# Patient Record
Sex: Male | Born: 1970
Health system: Southern US, Community
[De-identification: ages and names within clinical notes are randomized; demographics above are authoritative.]

## PROBLEM LIST (undated history)

## (undated) DIAGNOSIS — T7840XA Allergy, unspecified, initial encounter: Secondary | ICD-10-CM

## (undated) DIAGNOSIS — E785 Hyperlipidemia, unspecified: Secondary | ICD-10-CM

## (undated) DIAGNOSIS — I10 Essential (primary) hypertension: Secondary | ICD-10-CM

## (undated) DIAGNOSIS — J849 Interstitial pulmonary disease, unspecified: Secondary | ICD-10-CM

## (undated) HISTORY — PX: TOTAL KNEE ARTHROPLASTY: SHX125

## (undated) HISTORY — PX: JOINT REPLACEMENT: SHX530

## (undated) HISTORY — DX: Hyperlipidemia, unspecified: E78.5

## (undated) HISTORY — PX: HAND DEBRIDEMENT: SHX974

## (undated) HISTORY — DX: Interstitial pulmonary disease, unspecified: J84.9

## (undated) HISTORY — DX: Allergy, unspecified, initial encounter: T78.40XA

## (undated) HISTORY — PX: KNEE ARTHROSCOPY W/ ACL RECONSTRUCTION: SHX1858

## (undated) HISTORY — PX: KNEE SURGERY: SHX244

## (undated) HISTORY — DX: Essential (primary) hypertension: I10

---

## 1994-01-05 HISTORY — PX: COLONOSCOPY: SHX174

## 1999-02-15 ENCOUNTER — Emergency Department (HOSPITAL_COMMUNITY): Admission: EM | Admit: 1999-02-15 | Discharge: 1999-02-15 | Payer: Self-pay | Admitting: Emergency Medicine

## 2003-11-27 ENCOUNTER — Ambulatory Visit: Payer: Self-pay | Admitting: Internal Medicine

## 2003-12-07 ENCOUNTER — Ambulatory Visit: Payer: Self-pay | Admitting: Internal Medicine

## 2004-01-17 ENCOUNTER — Ambulatory Visit: Payer: Self-pay | Admitting: Internal Medicine

## 2004-01-24 ENCOUNTER — Ambulatory Visit: Payer: Self-pay | Admitting: Internal Medicine

## 2005-10-14 ENCOUNTER — Ambulatory Visit (HOSPITAL_BASED_OUTPATIENT_CLINIC_OR_DEPARTMENT_OTHER): Admission: RE | Admit: 2005-10-14 | Discharge: 2005-10-14 | Payer: Self-pay | Admitting: Orthopedic Surgery

## 2005-10-28 ENCOUNTER — Ambulatory Visit: Payer: Self-pay | Admitting: Internal Medicine

## 2005-10-28 LAB — CONVERTED CEMR LAB
ALT: 63 units/L — ABNORMAL HIGH (ref 0–40)
AST: 33 units/L (ref 0–37)
Albumin: 4.7 g/dL (ref 3.5–5.2)
Alkaline Phosphatase: 43 units/L (ref 39–117)
BUN: 14 mg/dL (ref 6–23)
Basophils Absolute: 0.1 10*3/uL (ref 0.0–0.1)
Basophils Relative: 1.1 % — ABNORMAL HIGH (ref 0.0–1.0)
CO2: 29 meq/L (ref 19–32)
Calcium: 10.2 mg/dL (ref 8.4–10.5)
Chloride: 101 meq/L (ref 96–112)
Chol/HDL Ratio, serum: 11
Cholesterol: 367 mg/dL (ref 0–200)
Creatinine, Ser: 1 mg/dL (ref 0.4–1.5)
Eosinophil percent: 1.8 % (ref 0.0–5.0)
GFR calc non Af Amer: 90 mL/min
Glomerular Filtration Rate, Af Am: 109 mL/min/{1.73_m2}
Glucose, Bld: 95 mg/dL (ref 70–99)
HCT: 44.3 % (ref 39.0–52.0)
HDL: 33.3 mg/dL — ABNORMAL LOW (ref >39.0)
Hemoglobin: 15.1 g/dL (ref 13.0–17.0)
LDL DIRECT: 226.7 mg/dL
Lymphocytes Relative: 24 % (ref 12.0–46.0)
MCHC: 34 g/dL (ref 30.0–36.0)
MCV: 88.9 fL (ref 78.0–100.0)
Monocytes Absolute: 0.5 10*3/uL (ref 0.2–0.7)
Monocytes Relative: 6.1 % (ref 3.0–11.0)
Neutro Abs: 5.5 10*3/uL (ref 1.4–7.7)
Neutrophils Relative %: 67 % (ref 43.0–77.0)
Platelets: 422 10*3/uL — ABNORMAL HIGH (ref 150–400)
Potassium: 4.3 meq/L (ref 3.5–5.1)
RBC: 4.98 M/uL (ref 4.22–5.81)
RDW: 13 % (ref 11.5–14.6)
Sodium: 138 meq/L (ref 135–145)
TSH: 1.07 microintl units/mL (ref 0.35–5.50)
Total Bilirubin: 1 mg/dL (ref 0.3–1.2)
Total Protein: 8.1 g/dL (ref 6.0–8.3)
Triglyceride fasting, serum: 494 mg/dL (ref 0–149)
VLDL: 99 mg/dL — ABNORMAL HIGH (ref 0–40)
WBC: 8.2 10*3/uL (ref 4.5–10.5)

## 2005-11-04 ENCOUNTER — Ambulatory Visit: Payer: Self-pay | Admitting: Internal Medicine

## 2005-11-04 DIAGNOSIS — Z91199 Patient's noncompliance with other medical treatment and regimen due to unspecified reason: Secondary | ICD-10-CM | POA: Insufficient documentation

## 2005-11-04 DIAGNOSIS — F1010 Alcohol abuse, uncomplicated: Secondary | ICD-10-CM | POA: Insufficient documentation

## 2005-11-04 DIAGNOSIS — I10 Essential (primary) hypertension: Secondary | ICD-10-CM | POA: Insufficient documentation

## 2005-11-04 DIAGNOSIS — E785 Hyperlipidemia, unspecified: Secondary | ICD-10-CM | POA: Insufficient documentation

## 2005-11-04 DIAGNOSIS — Z9119 Patient's noncompliance with other medical treatment and regimen: Secondary | ICD-10-CM | POA: Insufficient documentation

## 2005-11-04 LAB — CONVERTED CEMR LAB
Cholesterol, target level: 200 mg/dL
HDL goal, serum: 40 mg/dL
LDL Goal: 160 mg/dL

## 2005-12-22 ENCOUNTER — Ambulatory Visit: Payer: Self-pay | Admitting: Family Medicine

## 2010-03-28 ENCOUNTER — Encounter (HOSPITAL_COMMUNITY)
Admission: RE | Admit: 2010-03-28 | Discharge: 2010-03-28 | Disposition: A | Discharge status: Home / Self Care | Payer: 59 | Source: Ambulatory Visit | Attending: Orthopedic Surgery | Admitting: Orthopedic Surgery

## 2010-03-28 ENCOUNTER — Other Ambulatory Visit (HOSPITAL_COMMUNITY): Payer: Self-pay | Admitting: Orthopedic Surgery

## 2010-03-28 DIAGNOSIS — Z01818 Encounter for other preprocedural examination: Secondary | ICD-10-CM

## 2010-03-28 LAB — COMPREHENSIVE METABOLIC PANEL
AST: 34 U/L (ref 0–37)
Alkaline Phosphatase: 44 U/L (ref 39–117)
CO2: 26 mEq/L (ref 19–32)
Chloride: 100 mEq/L (ref 96–112)
Creatinine, Ser: 1.01 mg/dL (ref 0.4–1.5)
GFR calc Af Amer: >60 mL/min (ref >60)
GFR calc non Af Amer: >60 mL/min (ref >60)
Potassium: 4 mEq/L (ref 3.5–5.1)
Total Bilirubin: 0.5 mg/dL (ref 0.3–1.2)

## 2010-03-28 LAB — URINALYSIS, ROUTINE W REFLEX MICROSCOPIC
Glucose, UA: NEGATIVE mg/dL
Ketones, ur: NEGATIVE mg/dL
Nitrite: NEGATIVE
Protein, ur: NEGATIVE mg/dL

## 2010-03-28 LAB — CBC
HCT: 41.8 % (ref 39.0–52.0)
Hemoglobin: 14.7 g/dL (ref 13.0–17.0)
MCH: 30.4 pg (ref 26.0–34.0)
MCHC: 35.2 g/dL (ref 30.0–36.0)
MCV: 86.5 fL (ref 78.0–100.0)
Platelets: 307 10*3/uL (ref 150–400)
RBC: 4.83 MIL/uL (ref 4.22–5.81)
RDW: 12.4 % (ref 11.5–15.5)
WBC: 7.4 10*3/uL (ref 4.0–10.5)

## 2010-03-28 LAB — ABO/RH: ABO/RH(D): A POS

## 2010-03-28 LAB — PROTIME-INR
INR: 0.96 (ref 0.00–1.49)
Prothrombin Time: 13 seconds (ref 11.6–15.2)

## 2010-03-28 LAB — APTT: aPTT: 29 seconds (ref 24–37)

## 2010-04-02 ENCOUNTER — Inpatient Hospital Stay (HOSPITAL_COMMUNITY): Payer: 59

## 2010-04-02 ENCOUNTER — Inpatient Hospital Stay (HOSPITAL_COMMUNITY)
Admission: RE | Admit: 2010-04-02 | Discharge: 2010-04-06 | DRG: 462 | Disposition: A | Discharge status: Home Health | Payer: 59 | Source: Ambulatory Visit | Attending: Orthopedic Surgery | Admitting: Orthopedic Surgery

## 2010-04-02 DIAGNOSIS — E7800 Pure hypercholesterolemia, unspecified: Secondary | ICD-10-CM | POA: Diagnosis present

## 2010-04-02 DIAGNOSIS — I10 Essential (primary) hypertension: Secondary | ICD-10-CM | POA: Diagnosis present

## 2010-04-02 DIAGNOSIS — IMO0002 Reserved for concepts with insufficient information to code with codable children: Principal | ICD-10-CM | POA: Diagnosis present

## 2010-04-02 DIAGNOSIS — M1710 Unilateral primary osteoarthritis, unspecified knee: Principal | ICD-10-CM | POA: Diagnosis present

## 2010-04-02 DIAGNOSIS — L02419 Cutaneous abscess of limb, unspecified: Secondary | ICD-10-CM | POA: Diagnosis not present

## 2010-04-02 DIAGNOSIS — Z01812 Encounter for preprocedural laboratory examination: Secondary | ICD-10-CM

## 2010-04-03 LAB — BASIC METABOLIC PANEL
BUN: 6 mg/dL (ref 6–23)
CO2: 27 mEq/L (ref 19–32)
Chloride: 100 mEq/L (ref 96–112)
Creatinine, Ser: 1.07 mg/dL (ref 0.4–1.5)
Glucose, Bld: 111 mg/dL — ABNORMAL HIGH (ref 70–99)
Potassium: 3.2 mEq/L — ABNORMAL LOW (ref 3.5–5.1)

## 2010-04-03 LAB — CBC
HCT: 34 % — ABNORMAL LOW (ref 39.0–52.0)
MCH: 30.3 pg (ref 26.0–34.0)
MCHC: 34.4 g/dL (ref 30.0–36.0)
MCV: 88.1 fL (ref 78.0–100.0)
RDW: 12.6 % (ref 11.5–15.5)

## 2010-04-03 LAB — PROTIME-INR: INR: 1.17 (ref 0.00–1.49)

## 2010-04-04 LAB — CBC
HCT: 35.2 % — ABNORMAL LOW (ref 39.0–52.0)
MCH: 29.6 pg (ref 26.0–34.0)
MCHC: 34.1 g/dL (ref 30.0–36.0)
MCV: 86.9 fL (ref 78.0–100.0)
Platelets: 204 10*3/uL (ref 150–400)
RDW: 12.6 % (ref 11.5–15.5)

## 2010-04-04 LAB — BASIC METABOLIC PANEL
CO2: 27 mEq/L (ref 19–32)
Calcium: 9.3 mg/dL (ref 8.4–10.5)
Chloride: 97 mEq/L (ref 96–112)
Creatinine, Ser: 0.88 mg/dL (ref 0.4–1.5)
Glucose, Bld: 116 mg/dL — ABNORMAL HIGH (ref 70–99)

## 2010-04-05 LAB — BASIC METABOLIC PANEL
BUN: 9 mg/dL (ref 6–23)
CO2: 27 mEq/L (ref 19–32)
GFR calc non Af Amer: >60 mL/min (ref >60)
Glucose, Bld: 105 mg/dL — ABNORMAL HIGH (ref 70–99)
Potassium: 3.7 mEq/L (ref 3.5–5.1)
Sodium: 133 mEq/L — ABNORMAL LOW (ref 135–145)

## 2010-04-05 LAB — PROTIME-INR: Prothrombin Time: 21.3 seconds — ABNORMAL HIGH (ref 11.6–15.2)

## 2010-04-05 LAB — CBC
MCH: 29.9 pg (ref 26.0–34.0)
MCHC: 34.4 g/dL (ref 30.0–36.0)
MCV: 87.1 fL (ref 78.0–100.0)
Platelets: 224 10*3/uL (ref 150–400)

## 2010-04-06 LAB — BASIC METABOLIC PANEL
BUN: 10 mg/dL (ref 6–23)
CO2: 25 mEq/L (ref 19–32)
Chloride: 94 mEq/L — ABNORMAL LOW (ref 96–112)
Creatinine, Ser: 0.82 mg/dL (ref 0.4–1.5)
Glucose, Bld: 107 mg/dL — ABNORMAL HIGH (ref 70–99)
Potassium: 3.9 mEq/L (ref 3.5–5.1)

## 2010-04-06 LAB — CBC
HCT: 31 % — ABNORMAL LOW (ref 39.0–52.0)
Hemoglobin: 10.6 g/dL — ABNORMAL LOW (ref 13.0–17.0)
MCH: 29.3 pg (ref 26.0–34.0)
MCHC: 34.2 g/dL (ref 30.0–36.0)
RDW: 12.6 % (ref 11.5–15.5)

## 2010-04-06 LAB — PROTIME-INR: INR: 1.83 — ABNORMAL HIGH (ref 0.00–1.49)

## 2010-04-10 NOTE — Op Note (Signed)
NAMEDARRIE, MACMILLAN NO.:  [id number]  MEDICAL RECORD NO.:  [id number]           PATIENT TYPE:  I  LOCATION:  5041                         FACILITY:  [facility]  PHYSICIAN:  Loreta Ave, M.D. DATE OF BIRTH:  March 19, 1970  DATE OF PROCEDURE:  04/03/2010 DATE OF DISCHARGE:                              OPERATIVE REPORT   PREOPERATIVE DIAGNOSES: 1. Right knee end-stage degenerative arthritis.  Previous operative     intervention for anterior cruciate ligament reconstruction with     retained metallic screws, proximal tibia and distal femur.  Varus     alignment. 2. Left knee end-stage degenerative arthritis, varus alignment.     Previous operative intervention with anterior cruciate ligament     reconstruction with retained transverse screw and washer as well as     two staples in the femoral shaft well above the knee joint.  POSTOPERATIVE DIAGNOSIS: 1. Right knee end-stage degenerative arthritis.  Previous operative     intervention for anterior cruciate ligament reconstruction with     retained metallic screws, proximal tibia and distal femur.  Varus     alignment. 2. Left knee end-stage degenerative arthritis, varus alignment.     Previous operative intervention with anterior cruciate ligament     reconstruction with retained transverse screw and washer as well as     two staples in the femoral shaft well above the knee joint.  PROCEDURES: 1. Right knee modified minimally invasive total knee replacement,     Stryker triathlon prosthesis.  Soft tissue balancing.  Cemented     pegged posterior stabilized #6 femoral component.  Cemented #6     tibial component with a 9-mm polyethylene insert.  Cemented pegged     medial offset 38-mm patellar component. 2. Removal of the interference screw, proximal tibia, right. 3. Removal of interference screw, distal femur, right. 4. Left knee modified minimally invasive total knee replacement,     Stryker triathlon  prosthesis.  Soft tissue balancing.  Cemented     pegged posterior stabilized #6 femoral component.  Cemented #6     tibial component with a 9-mm polyethylene insert.  Cemented pegged     medial offset 38-mm patellar component.  SURGEON:  Loreta Ave, MD  ASSISTANT:  Zonia Kief, PA present throughout the entire case and necessary for timely completion of the procedure.  ANESTHESIA:  General.  ESTIMATED BLOOD LOSS:  Minimal.  SPECIMENS:  None.  COUNTS:  None.  COMPLICATIONS:  None.  DRESSING:  Soft compressive with Hemovac x1 in each knee.  Knee immobilizer, each knee.  TOURNIQUET TIME:  1 hour 30 minutes on the left and 1 hour 15 minutes on the right.  PROCEDURE IN DETAIL:  The patient was brought to the operating room and placed on the operating table in supine position.  After adequate anesthesia had been obtained, both knees were examined.  Mild flexion contracture on both sides.  Further flexion to 120.  Varus alignment partially correctable.  Tourniquet applied to both legs.  Both were then prepped and draped in usual sterile fashion.  Attention turned to the left first.  Exsanguinated  with elevation and Esmarch, tourniquet inflated to 350 mmHg.  Straight incision above the patella down to tibial tubercle.  Hemostasis with cautery.  Medial arthrotomy, vastus splitting preserving quad tendon.  Knee exposed.  Intra-articular adhesions and scar removed.  Grade 4 changes throughout.  Loose bodies. Periarticular spurs, remnants of menisci, cruciate ligaments excised. Distal femur exposed.  Intramedullary guide was placed in the distal femur and I was able to get by the staples and screws, so they did not have to be removed.  Distal cut 10 mm in 5 degrees of valgus.  Using epicondylar axis, sized, cut, and fitted for posterior stabilized, pegged #6 component.  Proximal tibial resection, extramedullary guide, 3- degree posterior slope cut.  Sized to #6 component as  well.  Patella exposed, posterior 10 mm removed.  Sized, drilled, and fitted for a 38- mm component.  All recess examined to be sure all spurs and loose bodies removed including the back cap.  Irrigated.  Trials put in place.  #6 above and below 9-mm insert.  38 mm on the patella.  With this construct, full extension, full flexion and good mechanical axis, good motion and stability.  Good patellofemoral tracking.  Tibia was marked for rotation and reamed.  Trials were removed.  Copious irrigation with a pulse irrigating device.  Cement prepared and placed on all components, firmly seated.  Polyethylene attached to tibia and knee reduced.  Patella held with a clamp.  Once the cement hardened, the knee was reexamined, again pleased with alignment, stability, and motion. Hemovac was placed through a separate stab wound.  Arthrotomy closed with #1 Vicryl.  Skin and subcutaneous tissue with Vicryl and staples. Sterile compressive temporary dressing applied.  The tourniquet was left up until we were well into the completion of the right knee to avoid bleeding intraoperatively.  It was deflated at 1 hour and 30 minutes from start on the left.  Attention was turned to the right. Exsanguinated with elevation and Esmarch, tourniquet inflated to 350 mmHg.  Straight incision above the patella down to tibial tubercle. Hemostasis with cautery.  Medial arthrotomy.  Medial capsule release. The proximal tibia exposed.  There was bony overgrowth but I was able to find the screw, placed from outside-in.  This was exposed and then able to be removed with a standard large AO screwdriver.  Knee exposed. Marked overgrowth of the notch, so I could not immediately retrieve the femoral screw.  Remnants of menisci, cruciate ligaments, periarticular spurs, and loose bodies removed.  Intramedullary guide on the femur.  10- mm resection set at 5 degrees of valgus.  Using epicondylar axis, the femur was then sized,  cut, and fitted for posterior stabilized #6 component.  Before doing the box cut, I was able to get enough exposure to find the femoral screw core bone around it and then removed that completely.  I then completed the preparation of femur.  Good sizing and fitting with a #6 component.  Extramedullary guide on the tibia.  3- degree posterior slope cut below the defect medially.  Sized to #6 component.  Debris cleared throughout the knee.  Patella exposed, posterior 10 mm removed.  Drilled, sized, and fitted for a 38-mm component.  Trials put in place.  #6 above and below and a 38 on the patella.  9-mm insert.  This gave me excellent mechanical axis, good alignment, good stability, good motion, and good tracking.  Tibia was marked for rotation and reamed.  All trials were removed.  Copious irrigation with pulse irrigating device.  Cement prepared and placed on all components, firmly seated.  Polyethylene attached to tibia and the knee reduced in full extension.  Patella held with a clamp.  Once the cement hardened, the knee was reexamined.  Again, very pleased with alignment, stability, and motion.  Wound was thoroughly irrigated. Hemovac was placed and brought through a separate stab wound. Arthrotomy closed with #1 Vicryl.  Skin and subcutaneous tissue with Vicryl and staples.  Permanent dressing was applied to both sides.  Both Hemovacs were attached to a Autovac device.  Tourniquet was then deflated and removed.  Knee immobilizer was placed on both sides. Anesthesia reversed.  Brought to recovery room.  Tolerated the surgery well.  No complications.     Loreta Ave, M.D.     DFM/MEDQ  D:  04/03/2010  T:  04/04/2010  Job:  [id number]  Electronically Signed by Mckinley Jewel M.D. on 04/10/2010 02:09:42 PM

## 2010-05-13 NOTE — Discharge Summary (Signed)
[name], GETER NO.:  [id number]  MEDICAL RECORD NO.:  [id number]           PATIENT TYPE:  I  LOCATION:  5041                         FACILITY:  [facility]  PHYSICIAN:  Loreta Ave, M.D. DATE OF BIRTH:  1970/06/22  DATE OF ADMISSION:  04/02/2010 DATE OF DISCHARGE:  04/06/2010                              DISCHARGE SUMMARY   FINAL DIAGNOSIS:  Status post bilateral total knee replacements for end- stage degenerative joint disease.  HISTORY OF PRESENT ILLNESS:  A 40 year old white male with history of end-stage DJD bilateral knees and chronic pain presented to our office for preop evaluation for bilateral total knee replacements.  He had progressive worsening pain with failed response with conservative treatment.  Significant decrease in daily activities due to the ongoing complaint.  HOSPITAL COURSE:  On April 02, 2010, the patient was taken to the [facility] OR and bilateral total knee replacements performed along with the screw removal x2, right knee.  SURGEON:  Loreta Ave, MD  ASSISTANT:  Genene Churn. Barry Dienes, [place]  ANESTHESIA:  General.  No specimens.  Minimal blood loss.  TOURNIQUET TIME:  Left knee 117 minutes and right knee 90 minutes.  Autovac drainage used for both knees.  There were no surgical or anesthesia complications and patient was transferred to recovery room in stable condition.  When patient was moved to the orthopedic unit, pharmacy protocol Coumadin and Lovenox started for DVT prophylaxis.  On April 03, 2010, the patient doing well.  Temp 99.8, pulse 97, respirations 20, blood pressure 139/66.  Hemoglobin 11.7, hematocrit 34.0, INR 1.17.  Dressing was clean, dry, and intact.  Bilateral calves nontender and neurovascularly intact.  The skin was warm and dry.  PT/OT consults.  On April 04, 2010, the patient doing well with better pain control. Hemoglobin 12.0, hematocrit 35.2, sodium 134, potassium 3.7, chloride 97, CO2 of  27, BUN 4, creatinine 0.88, glucose 116, INR 1.56.  Bilateral knee wounds looked good and staples intact.  No drainage or signs of infection.  Bilateral knee drains removed.  Calves nontender and neurovascularly intact.  The skin was warm and dry.  Discontinued PCA and saline locked IV.  Progressing with rehab.  On April 05, 2010, the patient doing well.  Temp 98.4, pulse 108, respirations 18, blood pressure 123/87.  Hemoglobin 11.1, INR 1.83.  Right knee wound looks good.  Left knee, he had a slight erythema medial knee and thigh.  He was started on Keflex for possible cellulitis.  On April 06, 2010, the patient doing well, and has made excellent progress with therapy.  He is ready to discharge home.  MEDICATIONS: 1. Dilaudid 2 mg 1-2 tabs p.o. q.4-6 hours p.r.n. for pain. 2. Robaxin 500 mg 1 tab p.o. q.6 hours p.r.n. for spasms. 3. Lovenox one subcu injection q.12 hours and stop when Coumadin is     therapeutic. 4. Coumadin pharmacy protocol. 5. Keflex 500 mg 1 tablet p.o. q.6 hours x10 days.  CONDITION:  Good and stable.  DISPOSITION:  Discharged home.  INSTRUCTIONS:  The patient will work with home health, PT, and OT to improve ambulation and  knee range of motion and strengthening. Weightbear as tolerated.  He can shower, but no tub soaking.  Coumadin x4 weeks postop DVT prophylaxis.  He will follow up when he is 2 weeks postop for recheck and bilateral knee staple removal.  Return sooner if needed.     Genene Churn. Denton Meek.   ______________________________ Loreta Ave, M.D.    JMO/MEDQ  D:  04/23/2010  T:  04/24/2010  Job:  [id number]  Electronically Signed by Zonia Kief P.A. on 05/05/2010 02:05:03 PM Electronically Signed by Mckinley Jewel M.D. on 05/13/2010 01:27:37 PM

## 2012-11-08 ENCOUNTER — Ambulatory Visit (INDEPENDENT_AMBULATORY_CARE_PROVIDER_SITE_OTHER): Payer: 59 | Admitting: Family Medicine

## 2012-11-08 ENCOUNTER — Ambulatory Visit: Payer: 59

## 2012-11-08 VITALS — BP 142/82 | HR 77 | Temp 98.5°F | Resp 18 | Ht 72.0 in | Wt 233.0 lb

## 2012-11-08 DIAGNOSIS — IMO0002 Reserved for concepts with insufficient information to code with codable children: Secondary | ICD-10-CM

## 2012-11-08 DIAGNOSIS — T1490XA Injury, unspecified, initial encounter: Secondary | ICD-10-CM

## 2012-11-08 DIAGNOSIS — L03019 Cellulitis of unspecified finger: Secondary | ICD-10-CM

## 2012-11-08 DIAGNOSIS — Z23 Encounter for immunization: Secondary | ICD-10-CM

## 2012-11-08 DIAGNOSIS — L02519 Cutaneous abscess of unspecified hand: Secondary | ICD-10-CM

## 2012-11-08 DIAGNOSIS — L02511 Cutaneous abscess of right hand: Secondary | ICD-10-CM

## 2012-11-08 MED ORDER — AMOXICILLIN-POT CLAVULANATE 875-125 MG PO TABS: 1.0000 | ORAL_TABLET | Freq: Two times a day (BID) | ORAL | Status: DC

## 2012-11-08 NOTE — Progress Notes (Signed)
Subjective: 42 year old man who was building a deck and got a splinter in his right hand at the base of the right thumb approximately 2 months ago. He thought he got most of it out. Subsequent to that it is formed a hard swollen not at the base of the thumb on the palmar surface. It is been hard. He's not been able to get anything out of it. His wife wanted him to get it checked.  Objective: Approximately a 2 x 3 cm area of slight induration. It does not real acutely inflamed though the surface is a little red. The patient is nondiabetic.  Assessment: Cystic lesion status post foreign body right thumb  Plan: Patient is agreeable to going ahead and having it lanced and see what is in there.  [facility] reading (PRIMARY) by  Dr. Alwyn Ren No foreign body in soft tissues.  Small opacification of metacarpal ?   I&D was done after local anesthetic with 1% lidocaine. A small amount of watery pus was obtained. It appeared to be in 2 pockets. Could not express much more. Culture was taken.  Treat with antibiotic  Return if worse.

## 2012-11-08 NOTE — Patient Instructions (Signed)
Take Augmentin one twice daily  If the area is not gradually going down over the next couple of weeks we should recheck it. Return sooner if worse at any time.

## 2012-11-10 LAB — WOUND CULTURE
Gram Stain: NONE SEEN
Organism ID, Bacteria: NO GROWTH

## 2012-11-29 ENCOUNTER — Ambulatory Visit (INDEPENDENT_AMBULATORY_CARE_PROVIDER_SITE_OTHER): Payer: 59 | Admitting: Physician Assistant

## 2012-11-29 VITALS — BP 136/88 | HR 75 | Temp 98.2°F | Resp 16 | Ht 73.5 in | Wt 233.4 lb

## 2012-11-29 DIAGNOSIS — L03119 Cellulitis of unspecified part of limb: Secondary | ICD-10-CM

## 2012-11-29 DIAGNOSIS — R05 Cough: Secondary | ICD-10-CM

## 2012-11-29 DIAGNOSIS — R059 Cough, unspecified: Secondary | ICD-10-CM

## 2012-11-29 DIAGNOSIS — L02519 Cutaneous abscess of unspecified hand: Secondary | ICD-10-CM

## 2012-11-29 DIAGNOSIS — J40 Bronchitis, not specified as acute or chronic: Secondary | ICD-10-CM

## 2012-11-29 MED ORDER — DOXYCYCLINE HYCLATE 100 MG PO CAPS: 100.0000 mg | ORAL_CAPSULE | Freq: Two times a day (BID) | ORAL | Status: DC

## 2012-11-29 MED ORDER — HYDROCODONE-HOMATROPINE 5-1.5 MG/5ML PO SYRP: ORAL_SOLUTION | ORAL | Status: DC

## 2012-11-29 NOTE — Progress Notes (Signed)
Patient ID: Drew Knight MRN: [id number], DOB: 05-07-1970, 42 y.o. Date of Encounter: 11/29/2012, 9:26 AM  Primary Physician: Gwen Pounds, MD  Chief Complaint: URI and recheck of the right hand  HPI: 42 y.o. male with history below presents with 2 issues.  1) URI: 6 day history of nasal congestion, rhinorrhea, sinus pressure, post nasal drip, and cough. Afebrile. No chills. Cough is productive of green sputum and worse in the morning when he wakes up. No SOB or wheezing. No known sick contacts. No GI complaints.   2) Recheck of the right hand: Patient initially presented to clinic on 11/08/12 after suffering a splinter to the palmer surface of the base of his right thumb 2 months prior. He thought he had removed all of the splinter, but it had become hard and swollen. At that visit the area was x rayed, with no radiopaque foreign body seen and lanced. Upon having the lesion lanced it appeared to be in 2 pockets and a small amount of watery purulence was expressed. A culture was obtained which did not have any growth in 2 days. Provider was unable to express much more purulence and FB was not visualized or found at that OV.   Patient states the following Saturday he was able to express a 2 mm splinter from the opening himself at home. He is still able to express some purulence form the lesion each morning by unroofing the scab. He is tender just proximal and medial to the wound itself. He does state that the wound itself is improved from baseline and even from his visit on 11/08/12. He comes in today requesting further I&D for possible resolution of this.     Past Medical History  Diagnosis Date  . Hyperlipidemia   . Hypertension      Home Meds: Prior to Admission medications   Medication Sig Start Date End Date Taking? Authorizing Provider  lisinopril (PRINIVIL,ZESTRIL) 20 MG tablet Take 20 mg by mouth daily.   Yes Historical Provider, MD  rosuvastatin (CRESTOR) 40 MG tablet Take 40 mg  by mouth daily.   Yes Historical Provider, MD                  Allergies: No Known Allergies  History   Social History  . Marital Status: Married    Spouse Name: N/A    Number of Children: N/A  . Years of Education: N/A   Occupational History  . Not on file.   Social History Main Topics  . Smoking status: Former Games developer  . Smokeless tobacco: Not on file  . Alcohol Use: Yes  . Drug Use: No  . Sexual Activity: Not on file   Other Topics Concern  . Not on file   Social History Narrative  . No narrative on file     Review of Systems: Constitutional: negative for chills, fever, or fatigue  HEENT: see above Cardiovascular: negative for chest pain or palpitations Respiratory: positive for cough. negative for hemoptysis, wheezing, or shortness of breath Abdominal: negative for abdominal pain, nausea, vomiting, or diarrhea Dermatological: see above Neurologic: negative for headache   Physical Exam: Blood pressure 136/88, pulse 75, temperature 98.2 F (36.8 C), temperature source Oral, resp. rate 16, height 6' 1.5" (1.867 m), weight 233 lb 6.4 oz (105.87 kg), SpO2 98.00%., Body mass index is 30.37 kg/(m^2). General: Well developed, well nourished, in no acute distress. Head: Normocephalic, atraumatic, eyes without discharge, sclera non-icteric, nares are congested. Bilateral auditory canals clear, TM's  are without perforation, pearly grey and translucent with reflective cone of light bilaterally. Oral cavity moist, posterior pharynx with post nasal drip. No exudate, erythema, or peritonsillar abscess. Uvula midline. Neck: Supple. No thyromegaly. Full ROM. No lymphadenopathy. Lungs: Coarse breath sounds bilaterally without wheezes, rales, or rhonchi. Breathing is unlabored. Heart: RRR with S1 S2. No murmurs, rubs, or gallops appreciated. Msk:  Strength and tone normal for age. Extremities/Skin: Warm and dry. No clubbing or cyanosis. Right palmer hand with 1 cm scabbed over  vertical wound. Surrounding induration. There is TTP along the proximal/medial aspect of the wound. Able to express a drop of purulence mixed with blood. Culture is taken. No fluctuance. No surrounding erythema. FROM of all digits. Flexion and extension is intact. Normal sensation.   Neuro: Alert and oriented X 3. Moves all extremities spontaneously. Gait is normal. CNII-XII grossly in tact. Psych:  Responds to questions appropriately with a normal affect.     PROCEDURE NOTE: Verbal consent obtained. Risks and benefits of the procedure were explained to the patient. Patient made an informed decision to proceed with the procedure. Betadine prep per usual protocol. Local anesthesia obtained with 1% plain lidocaine 2 cc.  1 cm incision with medial flap made with 15 blade along lesion.  Culture taken prior to incision as lesion was already draining purulence mixed with blood.  Small amount of purulence expressed from proximal aspect. Lesion explored revealing no loculations. No FB visualized or felt with probe. Irrigated with normal saline. Dressed. Wound care instructions including precautions with patient. Patient tolerated the procedure well.     ASSESSMENT AND PLAN:  42 y.o. male with cellulitis of the right hand, bronchitis, and cough  1) Cellulitis of the right hand -I&D per above -Doxycycline 100 mg 1 po bid #20 no RF -Warm compresses -If persists into the next 5-7 days, or worsens at any point plan to refer to hand for further evaluation and treatment -Precautions discussed  2) Bronchitis and cough -Doxycycline per above -Hycodan #4oz 1 tsp po q 4-6 hours prn cough no RF SED -Rest/fluids   Signed, Eula Listen, PA-C [facility] [place], [place] [address] [phone] 11/29/2012 9:26 AM

## 2012-12-01 LAB — WOUND CULTURE
Gram Stain: NONE SEEN
Gram Stain: NONE SEEN

## 2012-12-02 ENCOUNTER — Other Ambulatory Visit: Payer: Self-pay | Admitting: Physician Assistant

## 2012-12-02 DIAGNOSIS — L03119 Cellulitis of unspecified part of limb: Secondary | ICD-10-CM

## 2015-05-21 ENCOUNTER — Other Ambulatory Visit: Payer: Self-pay | Admitting: Internal Medicine

## 2015-05-21 DIAGNOSIS — R7989 Other specified abnormal findings of blood chemistry: Secondary | ICD-10-CM

## 2015-05-21 DIAGNOSIS — R945 Abnormal results of liver function studies: Secondary | ICD-10-CM

## 2015-05-28 ENCOUNTER — Ambulatory Visit
Admission: RE | Admit: 2015-05-28 | Discharge: 2015-05-28 | Disposition: A | Discharge status: Home / Self Care | Payer: 59 | Source: Ambulatory Visit | Attending: Internal Medicine | Admitting: Internal Medicine

## 2015-05-28 DIAGNOSIS — R7989 Other specified abnormal findings of blood chemistry: Secondary | ICD-10-CM

## 2015-05-28 DIAGNOSIS — R945 Abnormal results of liver function studies: Secondary | ICD-10-CM

## 2016-05-20 DIAGNOSIS — I10 Essential (primary) hypertension: Secondary | ICD-10-CM | POA: Diagnosis not present

## 2016-05-20 DIAGNOSIS — E784 Other hyperlipidemia: Secondary | ICD-10-CM | POA: Diagnosis not present

## 2016-05-25 ENCOUNTER — Other Ambulatory Visit: Payer: Self-pay | Admitting: Internal Medicine

## 2016-05-25 DIAGNOSIS — J3089 Other allergic rhinitis: Secondary | ICD-10-CM | POA: Diagnosis not present

## 2016-05-25 DIAGNOSIS — E785 Hyperlipidemia, unspecified: Secondary | ICD-10-CM

## 2016-05-25 DIAGNOSIS — Z1389 Encounter for screening for other disorder: Secondary | ICD-10-CM | POA: Diagnosis not present

## 2016-05-25 DIAGNOSIS — Z0000 Encounter for general adult medical examination without abnormal findings: Secondary | ICD-10-CM | POA: Diagnosis not present

## 2016-05-25 DIAGNOSIS — R7309 Other abnormal glucose: Secondary | ICD-10-CM | POA: Diagnosis not present

## 2016-05-28 ENCOUNTER — Other Ambulatory Visit: Payer: 59

## 2016-07-10 DIAGNOSIS — L500 Allergic urticaria: Secondary | ICD-10-CM | POA: Diagnosis not present

## 2017-06-01 DIAGNOSIS — R82998 Other abnormal findings in urine: Secondary | ICD-10-CM | POA: Diagnosis not present

## 2017-06-01 DIAGNOSIS — I10 Essential (primary) hypertension: Secondary | ICD-10-CM | POA: Diagnosis not present

## 2017-06-01 DIAGNOSIS — Z0000 Encounter for general adult medical examination without abnormal findings: Secondary | ICD-10-CM | POA: Diagnosis not present

## 2017-06-07 DIAGNOSIS — R7309 Other abnormal glucose: Secondary | ICD-10-CM | POA: Diagnosis not present

## 2017-06-07 DIAGNOSIS — Z1389 Encounter for screening for other disorder: Secondary | ICD-10-CM | POA: Diagnosis not present

## 2017-06-07 DIAGNOSIS — J3089 Other allergic rhinitis: Secondary | ICD-10-CM | POA: Diagnosis not present

## 2017-06-07 DIAGNOSIS — Z125 Encounter for screening for malignant neoplasm of prostate: Secondary | ICD-10-CM | POA: Diagnosis not present

## 2017-06-07 DIAGNOSIS — Z0000 Encounter for general adult medical examination without abnormal findings: Secondary | ICD-10-CM | POA: Diagnosis not present

## 2017-06-10 ENCOUNTER — Other Ambulatory Visit: Payer: Self-pay | Admitting: Internal Medicine

## 2017-06-10 DIAGNOSIS — R030 Elevated blood-pressure reading, without diagnosis of hypertension: Secondary | ICD-10-CM | POA: Diagnosis not present

## 2017-06-10 DIAGNOSIS — E785 Hyperlipidemia, unspecified: Secondary | ICD-10-CM

## 2017-06-10 DIAGNOSIS — S339XXA Sprain of unspecified parts of lumbar spine and pelvis, initial encounter: Secondary | ICD-10-CM | POA: Diagnosis not present

## 2017-06-22 ENCOUNTER — Other Ambulatory Visit: Payer: 59

## 2017-06-28 ENCOUNTER — Ambulatory Visit
Admission: RE | Admit: 2017-06-28 | Discharge: 2017-06-28 | Disposition: A | Discharge status: Home / Self Care | Payer: 59 | Source: Ambulatory Visit | Attending: Internal Medicine | Admitting: Internal Medicine

## 2017-06-28 DIAGNOSIS — E785 Hyperlipidemia, unspecified: Secondary | ICD-10-CM

## 2017-08-05 ENCOUNTER — Encounter: Payer: Self-pay | Admitting: Neurology

## 2017-08-05 ENCOUNTER — Ambulatory Visit: Payer: 59 | Admitting: Neurology

## 2017-08-05 VITALS — BP 142/93 | HR 71 | Ht 73.0 in | Wt 247.0 lb

## 2017-08-05 DIAGNOSIS — E669 Obesity, unspecified: Secondary | ICD-10-CM

## 2017-08-05 DIAGNOSIS — Z820 Family history of epilepsy and other diseases of the nervous system: Secondary | ICD-10-CM

## 2017-08-05 DIAGNOSIS — R351 Nocturia: Secondary | ICD-10-CM

## 2017-08-05 DIAGNOSIS — R0683 Snoring: Secondary | ICD-10-CM

## 2017-08-05 DIAGNOSIS — G4719 Other hypersomnia: Secondary | ICD-10-CM

## 2017-08-05 DIAGNOSIS — G478 Other sleep disorders: Secondary | ICD-10-CM

## 2017-08-05 NOTE — Patient Instructions (Signed)

## 2017-08-05 NOTE — Progress Notes (Signed)
Subjective:    Patient ID: Drew Knight is a 47 y.o. male.  HPI     Star Age, MD, PhD [facility] [address], [address] [place], [place] [address]  Dear Dr. Virgina Jock,   I saw your patient, Drew Knight, upon your kind request in my neurologic clinic today for initial consultation of his sleep disorder, in particular, concern for underlying obstructive sleep apnea. The patient is unaccompanied today. As you know, [name] is a 47 year old right-handed gentleman with an underlying medical history of hypertension, hyperlipidemia, osteoarthritis, reflux disease, degenerative disc disease, and obesity, who reports snoring and poor sleep quality, sleep disruption and not waking up rested, daytime somnolence. I reviewed your office note from 06/07/2017, which you kindly included. His Epworth sleepiness score is 8 out of 24, fatigue score is 45 out of 63. 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 He is married and lives with his wife. They have 3 daughters, ages 85, 8 and 37. He quit smoking in 2007, drinks alcohol about 4 days out of the week, drinks caffeine daily, about 4 cups per day on average. His brother has sleep apnea, his mother has sleep apnea, both have CPAP machines. His parents have congestive heart failure. He has gained weight in the past year and his snoring became worse, he is trying to lose weight. He has nocturia about once per average night, denies restless leg symptoms or morning headaches. Bedtime is around 9 and while he does not have trouble falling asleep he does have trouble staying asleep. His rise time is 5 AM. He has had seasonal allergies.    His Past Medical History Is Significant For: Past Medical History:  Diagnosis Date  . Hyperlipidemia   . Hypertension     His Past Surgical History Is Significant For: Past Surgical History:  Procedure Laterality Date  . HAND DEBRIDEMENT    .  JOINT REPLACEMENT    . KNEE ARTHROSCOPY W/ ACL RECONSTRUCTION      His Family History Is Significant For: Family History  Problem Relation Age of Onset  . Hypertension Mother   . Heart disease Father   . Hyperlipidemia Father   . Hypertension Father   . Hyperlipidemia Brother     His Social History Is Significant For: Social History   Socioeconomic History  . Marital status: Married    Spouse name: Not on file  . Number of children: Not on file  . Years of education: Not on file  . Highest education level: Not on file  Occupational History  . Not on file  Social Needs  . Financial resource strain: Not on file  . Food insecurity:    Worry: Not on file    Inability: Not on file  . Transportation needs:    Medical: Not on file    Non-medical: Not on file  Tobacco Use  . Smoking status: Former Research scientist (life sciences)  . Smokeless tobacco: Never Used  Substance and Sexual Activity  . Alcohol use: Yes  . Drug use: No  . Sexual activity: Not on file  Lifestyle  . Physical activity:    Days per week: Not on file    Minutes per session: Not on file  . Stress: Not on file  Relationships  . Social connections:    Talks on phone: Not on file    Gets together: Not on file    Attends religious service: Not on  file    Active member of club or organization: Not on file    Attends meetings of clubs or organizations: Not on file    Relationship status: Not on file  Other Topics Concern  . Not on file  Social History Narrative  . Not on file    His Allergies Are:  No Known Allergies:   His Current Medications Are:  Outpatient Encounter Medications as of 08/05/2017  Medication Sig  . ezetimibe (ZETIA) 10 MG tablet Take 10 mg by mouth daily.  . fenofibrate 160 MG tablet Take 160 mg by mouth daily.  Marland Kitchen losartan (COZAAR) 50 MG tablet Take 50 mg by mouth daily.  . [DISCONTINUED] losartan (COZAAR) 100 MG tablet Take 100 mg by mouth daily.  . [DISCONTINUED] sildenafil (REVATIO) 20 MG tablet  Take 20 mg by mouth daily as needed.  . [DISCONTINUED] doxycycline (VIBRAMYCIN) 100 MG capsule Take 1 capsule (100 mg total) by mouth 2 (two) times daily.  . [DISCONTINUED] HYDROcodone-homatropine (HYCODAN) 5-1.5 MG/5ML syrup 1 TSP PO Q 4-6 HOURS PRN COUGH  . [DISCONTINUED] lisinopril (PRINIVIL,ZESTRIL) 20 MG tablet Take 20 mg by mouth daily.  . [DISCONTINUED] metaxalone (SKELAXIN) 800 MG tablet Take 800 mg by mouth 3 (three) times daily.  . [DISCONTINUED] rosuvastatin (CRESTOR) 40 MG tablet Take 40 mg by mouth daily.   No facility-administered encounter medications on file as of 08/05/2017.   :  Review of Systems:  Out of a complete 14 point review of systems, all are reviewed and negative with the exception of these symptoms as listed below: Review of Systems  Neurological:       Pt presents today to discuss his sleep. Pt has never had a sleep study but does endorse snoring.  Epworth Sleepiness Scale 0= would never doze 1= slight chance of dozing 2= moderate chance of dozing 3= high chance of dozing  Sitting and reading: 1 Watching TV: 2 Sitting inactive in a public place (ex. Theater or meeting): 2 As a passenger in a car for an hour without a break: 2 Lying down to rest in the afternoon: 0 Sitting and talking to someone: 0 Sitting quietly after lunch (no alcohol): 1 In a car, while stopped in traffic: 0 Total: 8     Objective:  Neurological Exam  Physical Exam Physical Examination:   Vitals:   08/05/17 1600  BP: (!) 142/93  Pulse: 71   General Examination: The patient is a very pleasant 47 y.o. male in no acute distress. He appears well-developed and well-nourished and well groomed.   HEENT: Normocephalic, atraumatic, pupils are equal, round and reactive to light and accommodation. Extraocular tracking is good without limitation to gaze excursion or nystagmus noted. Normal smooth pursuit is noted. Hearing is grossly intact. Face is symmetric with normal facial  animation and normal facial sensation. Speech is clear with no dysarthria noted. There is no hypophonia. There is no lip, neck/head, jaw or voice tremor. Neck is supple with full range of passive and active motion. There are no carotid bruits on auscultation. Oropharynx exam reveals: mild mouth dryness, adequate dental hygiene and moderate airway crowding, due to redundant soft palate, large uvula. Tonsils are in place, 1+ bilaterally. Mallampati is class II. Neck circumference is 17-7/8 inches. He has a mild overbite.  Chest: Clear to auscultation without wheezing, rhonchi or crackles noted.  Heart: S1+S2+0, regular and normal without murmurs, rubs or gallops noted.   Abdomen: Soft, non-tender and non-distended with normal bowel sounds appreciated on auscultation.  Extremities: There is no pitting edema in the distal lower extremities bilaterally.   Skin: Warm and dry without trophic changes noted.  Musculoskeletal: exam reveals no obvious joint deformities, tenderness or joint swelling or erythema.   Neurologically:  Mental status: The patient is awake, alert and oriented in all 4 spheres. His immediate and remote memory, attention, language skills and fund of knowledge are appropriate. There is no evidence of aphasia, agnosia, apraxia or anomia. Speech is clear with normal prosody and enunciation. Thought process is linear. Mood is normal and affect is normal.  Cranial nerves II - XII are as described above under HEENT exam. In addition: shoulder shrug is normal with equal shoulder height noted. Motor exam: Normal bulk, strength and tone is noted. There is no drift, tremor or rebound. Romberg is negative. Fine motor skills and coordination: intact with normal finger taps, normal hand movements, normal rapid alternating patting, normal foot taps and normal foot agility.  Cerebellar testing: No dysmetria or intention tremor. There is no truncal or gait ataxia.  Sensory exam: intact to light touch  in the upper and lower extremities.  Gait, station and balance: He stands easily. No veering to one side is noted. No leaning to one side is noted. Posture is age-appropriate and stance is narrow based. Gait shows normal stride length and pace. No issues turning. Tandem walk is unremarkable.  Assessment and Plan:  In summary, HENOK HEACOCK is a very pleasant 47 y.o.-year old male with an underlying medical history of hypertension, hyperlipidemia, osteoarthritis, reflux disease, degenerative disc disease, and obesity, whose history and physical exam are concerning for obstructive sleep apnea (OSA). I had a long chat with the patient about my findings and the diagnosis of OSA, its prognosis and treatment options. We talked about medical treatments, surgical interventions and non-pharmacological approaches. I explained in particular the risks and ramifications of untreated moderate to severe OSA, especially with respect to developing cardiovascular disease down the Road, including congestive heart failure, difficult to treat hypertension, cardiac arrhythmias, or stroke. Even type 2 diabetes has, in part, been linked to untreated OSA. Symptoms of untreated OSA include daytime sleepiness, memory problems, mood irritability and mood disorder such as depression and anxiety, lack of energy, as well as recurrent headaches, especially morning headaches. We talked about trying to maintain a healthy lifestyle in general, as well as the importance of weight control. I encouraged the patient to eat healthy, exercise daily and keep well hydrated, to keep a scheduled bedtime and wake time routine, to not skip any meals and eat healthy snacks in between meals. I advised the patient not to drive when feeling sleepy. I recommended the following at this time: sleep study with potential positive airway pressure titration. (We will score hypopneas at 4%).   I explained the sleep test procedure to the patient and also outlined  possible surgical and non-surgical treatment options of OSA, including the use of a custom-made dental device (which would require a referral to a specialist dentist or oral surgeon), upper airway surgical options, such as pillar implants, radiofrequency surgery, tongue base surgery, and UPPP (which would involve a referral to an ENT surgeon). Rarely, jaw surgery such as mandibular advancement may be considered.  I also explained the CPAP treatment option to the patient, who indicated that he would be willing to try CPAP if the need arises. I explained the importance of being compliant with PAP treatment, not only for insurance purposes but primarily to improve His symptoms, and for  the patient's long term health benefit, including to reduce His cardiovascular risks. I answered all his questions today and the patient was in agreement. I would like to see him back after the sleep study is completed and encouraged him to call with any interim questions, concerns, problems or updates.   Thank you very much for allowing me to participate in the care of this nice patient. If I can be of any further assistance to you please do not hesitate to call me at [phone].  Sincerely,   Star Age, MD, PhD

## 2017-08-09 ENCOUNTER — Telehealth: Payer: Self-pay

## 2017-08-09 DIAGNOSIS — R0683 Snoring: Secondary | ICD-10-CM

## 2017-08-09 DIAGNOSIS — E669 Obesity, unspecified: Secondary | ICD-10-CM

## 2017-08-09 DIAGNOSIS — Z820 Family history of epilepsy and other diseases of the nervous system: Secondary | ICD-10-CM

## 2017-08-09 DIAGNOSIS — R351 Nocturia: Secondary | ICD-10-CM

## 2017-08-09 NOTE — Telephone Encounter (Signed)
This patient's insurance denied an attended sleep study. I will order home sleep test.  

## 2017-08-09 NOTE — Telephone Encounter (Signed)
[employer] will deny in-lab sleep study per guidelines. Need HST order.

## 2017-08-23 ENCOUNTER — Ambulatory Visit (INDEPENDENT_AMBULATORY_CARE_PROVIDER_SITE_OTHER): Payer: 59 | Admitting: Neurology

## 2017-08-23 DIAGNOSIS — R351 Nocturia: Secondary | ICD-10-CM

## 2017-08-23 DIAGNOSIS — G4733 Obstructive sleep apnea (adult) (pediatric): Secondary | ICD-10-CM | POA: Diagnosis not present

## 2017-08-23 DIAGNOSIS — E669 Obesity, unspecified: Secondary | ICD-10-CM

## 2017-08-23 DIAGNOSIS — Z820 Family history of epilepsy and other diseases of the nervous system: Secondary | ICD-10-CM

## 2017-08-23 DIAGNOSIS — R0683 Snoring: Secondary | ICD-10-CM

## 2017-09-01 ENCOUNTER — Telehealth: Payer: Self-pay

## 2017-09-01 NOTE — Addendum Note (Signed)
Addended by: Star Age on: 09/01/2017 07:44 AM   Modules accepted: Orders

## 2017-09-01 NOTE — Procedures (Signed)
[facility] [place], [place] [address] NAME: Drew Knight                                                             DOB: [birth year] MEDICAL RECORD NUMBER  [id number]                                                DOS:  08/23/17 REFERRING PHYSICIAN: Shon Baton, MD STUDY PERFORMED: Home Sleep Study HISTORY: 47 year old man with a history of hypertension, hyperlipidemia, osteoarthritis, reflux disease, degenerative disc disease, and obesity, who reports snoring and poor sleep quality, sleep disruption and not waking up rested, daytime somnolence. His Epworth sleepiness score is 8 out of 24, fatigue score is 45 out of 63. BMI: 32.5.   STUDY RESULTS: Total Recording Time: 9 hours, 17 minutes Total Apnea/Hypopnea Index (AHI): 6.2/h, RDI: 10.3/h Average Oxygen Saturation: 91%, Lowest Oxygen Desaturation: 80%  Total Time Oxygen Saturation Below or at 88%: 95 minutes (17%) Average Heart Rate: 68 bpm (between 50 and 119 bpm) IMPRESSION: OSA RECOMMENDATION: This home sleep test demonstrates overall mild obstructive sleep apnea with a total AHI of 6/hour and O2 nadir of 80%. Given the patient's medical history and sleep related complaints, treatment with positive airway pressure is recommended, especially in light of lower oxygen saturations during the night. Treatment can be achieved in the form of autoPAP trial/titration at home. A full night CPAP titration study will help with proper treatment settings and mask fitting, if needed. Alternative treatments include weight loss along with avoidance of the supine sleep position, or an oral appliance in appropriate candidates. Please note that untreated obstructive sleep apnea may carry additional perioperative morbidity. Patients with significant obstructive sleep apnea should receive  perioperative PAP therapy and the surgeons and particularly the anesthesiologist should be informed of the diagnosis and the severity of the sleep disordered breathing. The patient should be cautioned not to drive, work at heights, or operate dangerous or heavy equipment when tired or sleepy. Review and reiteration of good sleep hygiene measures should be pursued with any patient. Other causes of the patient's symptoms, including circadian rhythm disturbances, an underlying mood disorder, medication effect and/or an underlying medical problem cannot be ruled out based on this test. Clinical correlation is recommended. The patient and his referring provider will be notified of the test results. The patient will be  seen in follow up in sleep clinic at [facility]. I certify that I have reviewed the raw data recording prior to the issuance of this report in accordance with the standards of the American Academy of Sleep Medicine (AASM).  Star Age, MD, PhD [facility]) Diplomat, ABPN (Neurology and Sleep)

## 2017-09-01 NOTE — Telephone Encounter (Signed)
I called pt. I discussed his HST results with him. Pt has declined auto pap at this time. He would like to think about this further and call me back with his decision. I did advise him that if he decides against auto pap to please still let me know, as we can discuss alternative treatments at that time. Pt asked me to send a copy of his HST results to Dr. Virgina Jock. Pt will call me back with his decision on pursuing treatment for mild osa. Pt verbalized understanding of results. Pt had no further questions at this time but was encouraged to call back if questions arise.

## 2017-09-01 NOTE — Telephone Encounter (Signed)
-----   Message from Star Age, MD sent at 09/01/2017  7:44 AM EDT ----- Patient referred by Dr. Virgina Jock, seen by me on 08/05/17, HST on 08/23/17.    Please call and notify the patient that the recent home sleep test showed obstructive sleep apnea. OSA is overall mild, but worth treating to see if he feels better after treatment. To that end I recommend treatment for this in the form of autoPAP, which means, that we don't have to bring him in for a sleep study with CPAP, but will let him try an autoPAP machine at home, through a DME company (of his choice, or as per insurance requirement). The DME representative will educate him on how to use the machine, how to put the mask on, etc. I have placed an order in the chart. Please send referral, talk to patient, send report to referring MD. We will need a FU in sleep clinic for 10 weeks post-PAP set up, please arrange that with me or one of our NPs. Also, remind Pt re: importance of compliance, is he is on autoPAP. Thanks,   Star Age, MD, PhD [facility])

## 2017-09-01 NOTE — Progress Notes (Signed)
Patient referred by Dr. Virgina Jock, seen by me on 08/05/17, HST on 08/23/17.    Please call and notify the patient that the recent home sleep test showed obstructive sleep apnea. OSA is overall mild, but worth treating to see if he feels better after treatment. To that end I recommend treatment for this in the form of autoPAP, which means, that we don't have to bring him in for a sleep study with CPAP, but will let him try an autoPAP machine at home, through a DME company (of his choice, or as per insurance requirement). The DME representative will educate him on how to use the machine, how to put the mask on, etc. I have placed an order in the chart. Please send referral, talk to patient, send report to referring MD. We will need a FU in sleep clinic for 10 weeks post-PAP set up, please arrange that with me or one of our NPs. Also, remind Pt re: importance of compliance, is he is on autoPAP. Thanks,   Star Age, MD, PhD [facility])

## 2018-03-01 DIAGNOSIS — I10 Essential (primary) hypertension: Secondary | ICD-10-CM | POA: Diagnosis not present

## 2018-03-01 DIAGNOSIS — J0100 Acute maxillary sinusitis, unspecified: Secondary | ICD-10-CM | POA: Diagnosis not present

## 2018-04-27 DIAGNOSIS — M25521 Pain in right elbow: Secondary | ICD-10-CM | POA: Diagnosis not present

## 2018-06-20 ENCOUNTER — Other Ambulatory Visit: Payer: Self-pay | Admitting: Internal Medicine

## 2018-06-20 DIAGNOSIS — R930 Abnormal findings on diagnostic imaging of skull and head, not elsewhere classified: Secondary | ICD-10-CM

## 2018-07-01 ENCOUNTER — Other Ambulatory Visit: Payer: Self-pay | Admitting: Internal Medicine

## 2018-07-01 DIAGNOSIS — R9389 Abnormal findings on diagnostic imaging of other specified body structures: Secondary | ICD-10-CM

## 2018-07-05 ENCOUNTER — Ambulatory Visit
Admission: RE | Admit: 2018-07-05 | Discharge: 2018-07-05 | Disposition: A | Discharge status: Home / Self Care | Payer: No Typology Code available for payment source | Source: Ambulatory Visit | Attending: Internal Medicine | Admitting: Internal Medicine

## 2018-07-05 DIAGNOSIS — R9389 Abnormal findings on diagnostic imaging of other specified body structures: Secondary | ICD-10-CM

## 2018-09-21 ENCOUNTER — Other Ambulatory Visit: Payer: Self-pay

## 2018-09-21 ENCOUNTER — Encounter: Payer: Self-pay | Admitting: Pulmonary Disease

## 2018-09-21 ENCOUNTER — Ambulatory Visit (INDEPENDENT_AMBULATORY_CARE_PROVIDER_SITE_OTHER): Payer: 59 | Admitting: Pulmonary Disease

## 2018-09-21 VITALS — BP 136/86 | HR 64 | Temp 97.6°F | Ht 73.0 in | Wt 241.8 lb

## 2018-09-21 DIAGNOSIS — J849 Interstitial pulmonary disease, unspecified: Secondary | ICD-10-CM | POA: Diagnosis not present

## 2018-09-21 LAB — SEDIMENTATION RATE: Sed Rate: 17 mm/hr — ABNORMAL HIGH (ref 0–15)

## 2018-09-21 NOTE — Progress Notes (Signed)
Subjective:    Patient ID: Drew Knight, male    DOB: 1970/02/18, 48 y.o.   MRN: [id number]  Patient being seen for an abnormal CT scan of the chest  He has no respiratory complaints  Was having work-up for cardiac risk profile had a CT scan for calcium scoring, this did reveal some haziness at the bases of the lungs Had a high-resolution CT scan concerning for interstitial lung disease  He is asymptomatic Not limited with activities Able to walk 3 to 4 miles without limitations at a good pace Very active  No occupational predisposition-no gardening, no exposure to inhalants, fertilizer -Works in Press photographer  No family history of lung disease known to patient  No history of skin rashes No Joint problems or history of arthritic conditions  Quit smoking June 2006  Has a history of hypertension, hypercholesterolemia  He has pet dogs  No exposure to birds   Past Medical History:  Diagnosis Date  . Hyperlipidemia   . Hypertension    Social History   Socioeconomic History  . Marital status: Married    Spouse name: Not on file  . Number of children: Not on file  . Years of education: Not on file  . Highest education level: Not on file  Occupational History  . Not on file  Social Needs  . Financial resource strain: Not on file  . Food insecurity    Worry: Not on file    Inability: Not on file  . Transportation needs    Medical: Not on file    Non-medical: Not on file  Tobacco Use  . Smoking status: Former Smoker    Quit date: 06/05/2004    Years since quitting: 14.3  . Smokeless tobacco: Never Used  Substance and Sexual Activity  . Alcohol use: Yes  . Drug use: No  . Sexual activity: Not on file  Lifestyle  . Physical activity    Days per week: Not on file    Minutes per session: Not on file  . Stress: Not on file  Relationships  . Social Herbalist on phone: Not on file    Gets together: Not on file    Attends religious service: Not on file    Active member of club or organization: Not on file    Attends meetings of clubs or organizations: Not on file    Relationship status: Not on file  . Intimate partner violence    Fear of current or ex partner: Not on file    Emotionally abused: Not on file    Physically abused: Not on file    Forced sexual activity: Not on file  Other Topics Concern  . Not on file  Social History Narrative  . Not on file   Family History  Problem Relation Age of Onset  . Hypertension Mother   . Heart disease Father   . Hyperlipidemia Father   . Hypertension Father   . Hyperlipidemia Brother      Review of Systems  Constitutional: Negative for fever and unexpected weight change.  HENT: Negative for congestion, dental problem, ear pain, nosebleeds, postnasal drip, rhinorrhea, sinus pressure, sneezing, sore throat and trouble swallowing.   Eyes: Negative for redness and itching.  Respiratory: Negative for cough, chest tightness, shortness of breath and wheezing.   Cardiovascular: Negative for palpitations and leg swelling.  Gastrointestinal: Negative for nausea and vomiting.  Genitourinary: Negative for dysuria.  Musculoskeletal: Negative for joint swelling.  Skin:  Negative for rash.  Allergic/Immunologic: Negative.  Negative for environmental allergies, food allergies and immunocompromised state.  Neurological: Negative for headaches.  Hematological: Does not bruise/bleed easily.  Psychiatric/Behavioral: Negative for dysphoric mood. The patient is not nervous/anxious.       Objective:   Physical Exam Constitutional:      Appearance: Normal appearance.  HENT:     Head: Normocephalic and atraumatic.     Mouth/Throat:     Mouth: Mucous membranes are moist.  Eyes:     General:        Right eye: No discharge.        Left eye: No discharge.  Neck:     Musculoskeletal: Normal range of motion. No muscular tenderness.  Cardiovascular:     Rate and Rhythm: Normal rate and regular rhythm.      Pulses: Normal pulses.     Heart sounds: Normal heart sounds. No murmur.  Pulmonary:     Effort: Pulmonary effort is normal. No respiratory distress.     Breath sounds: Normal breath sounds. No stridor. No wheezing or rhonchi.  Abdominal:     General: There is no distension.     Palpations: There is no mass.  Musculoskeletal: Normal range of motion.        General: No swelling.  Skin:    General: Skin is warm and dry.  Neurological:     General: No focal deficit present.     Mental Status: He is alert.  Psychiatric:        Mood and Affect: Mood normal.    Vitals:   09/21/18 1018  BP: 136/86  Pulse: 64  Temp: 97.6 F (36.4 C)  SpO2: 98%      Assessment & Plan:  .  Abnormal CT scan of the chest -Concern for interstitial lung disease -Asymptomatic -No cough, no shortness of breath -Not limited with activities of daily living -Past history of smoking -Was never diagnosed with any lung disease  Could be hypersensitivity pneumonitis-has no significant occupational predisposition or exposure to account for this  History of hypertension   Plan: Work-up for interstitial lung disease   -Pulmonary function test -Obtain blood work  -Hypersensitivity pneumonitis panel -ANA -Rheumatoid factor -Anti-CCP -ESR -SCL 70 -ANCA -Anti-Jo  Follow-up CT in 6 months to a year  I will see him back in the office in about 6 months We will update him on his results from the work-up as they become available

## 2018-09-21 NOTE — Patient Instructions (Signed)
Abnormal CT scan of the chest showing groundglass changes, concern for interstitial lung disease Asymptomatic with good functional status  We will get some blood work, studies to look for connective tissue disease Obtain a breathing study Obtain markers of inflammation  We will call you as we get results back, some of them may take a while to come back  We will see you back in the office in 6 months  Call if you were to develop any symptoms

## 2018-09-22 LAB — CYCLIC CITRUL PEPTIDE ANTIBODY, IGG: Cyclic Citrullin Peptide Ab: <16 UNITS

## 2018-09-22 LAB — RHEUMATOID FACTOR: Rheumatoid fact SerPl-aCnc: <14 IU/mL (ref <14)

## 2018-09-23 ENCOUNTER — Telehealth: Payer: Self-pay | Admitting: Pulmonary Disease

## 2018-09-23 NOTE — Telephone Encounter (Signed)
Inform patient  Blood work for connective tissue disorder which may cause scarring in the lungs are negative so far  Still awaiting testing for hypersensitivity-usually relating to things you may inhale that causes inflammation on the lungs

## 2018-09-23 NOTE — Telephone Encounter (Signed)
ATC pt, no answer. Left message for pt to call back.  

## 2018-09-27 LAB — HYPERSENSITIVITY PNEUMONITIS
A. Pullulans Abs: NEGATIVE
A.Fumigatus #1 Abs: NEGATIVE
Micropolyspora faeni, IgG: NEGATIVE
Pigeon Serum Abs: NEGATIVE
Thermoact. Saccharii: NEGATIVE
Thermoactinomyces vulgaris, IgG: NEGATIVE

## 2018-09-27 LAB — ANA+ENA+DNA/DS+SCL 70+SJOSSA/B
ANA Titer 1: NEGATIVE
ENA RNP Ab: <0.2 AI (ref 0.0–0.9)
ENA SM Ab Ser-aCnc: <0.2 AI (ref 0.0–0.9)
ENA SSA (RO) Ab: <0.2 AI (ref 0.0–0.9)
ENA SSB (LA) Ab: <0.2 AI (ref 0.0–0.9)
Scleroderma (Scl-70) (ENA) Antibody, IgG: <0.2 AI (ref 0.0–0.9)
dsDNA Ab: 1 IU/mL (ref 0–9)

## 2019-02-10 IMAGING — CT CT HEART SCORING
3 series · 14 of 20 positions shown, 16 images · non-contrast
Comparison: None.

CLINICAL DATA: 47-year-old male former smoker (quit 12 years ago)
with history of hypertension and high cholesterol. Elevated
triglycerides. Family history of cardiac disease.

EXAM:
CT HEART FOR CALCIUM SCORING
TECHNIQUE: CT heart was performed on a 64 channel system using prospective ECG
gating.
A non-contrast exam for calcium scoring was performed.
Note that this exam targets the heart and the chest was not imaged
in its entirety.

[Series 2: calcium scoring 2.00 qr36 bestdiast 69% · axial · 0.39mm/px · z∈[+1453,+1525]mm · 4 of 60 slices shown]
[im 12/60  vessel]
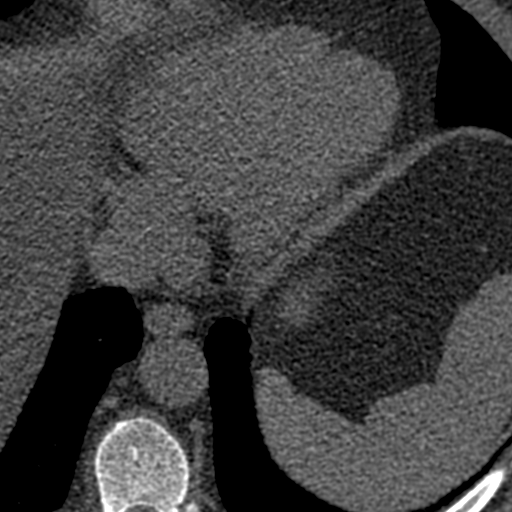
[im 24/60  vessel]
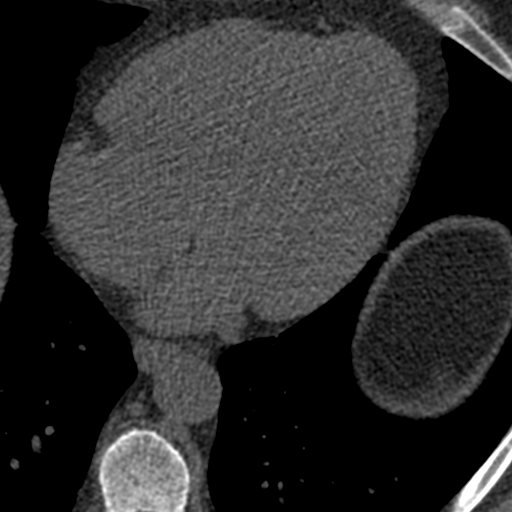
[im 36/60  vessel]
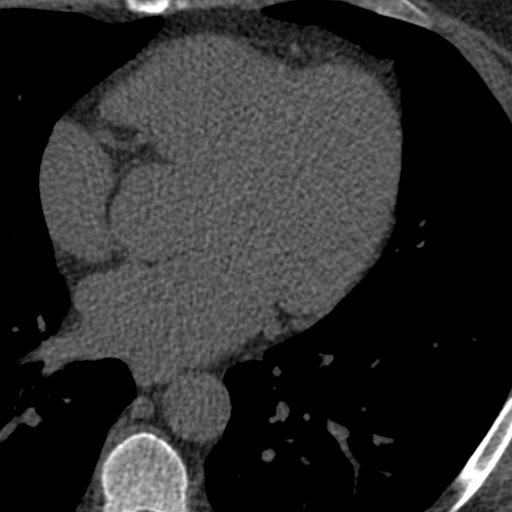
[im 48/60  vessel]
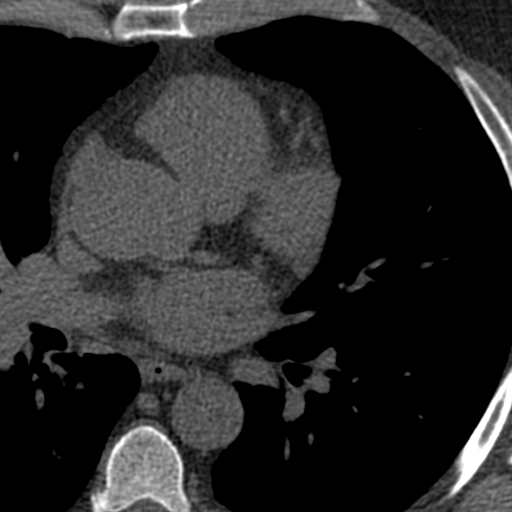

[Series 3: calcium scoring 2.00 br40 bestdiast 69% ax fov · axial · 0.57mm/px · z∈[+1449,+1529]mm · 5 of 60 slices shown, 7 images]
[im 10/60  vessel]
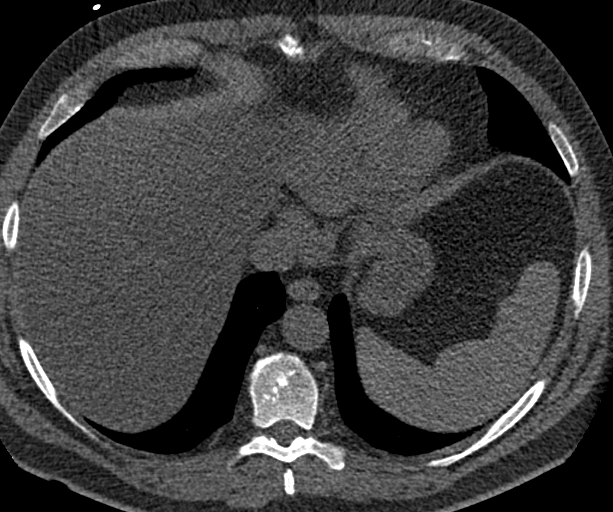
[im 10/60  lung]
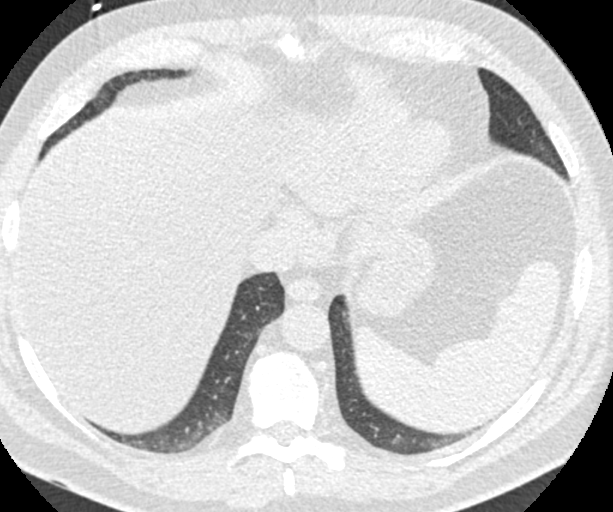
[im 20/60  vessel]
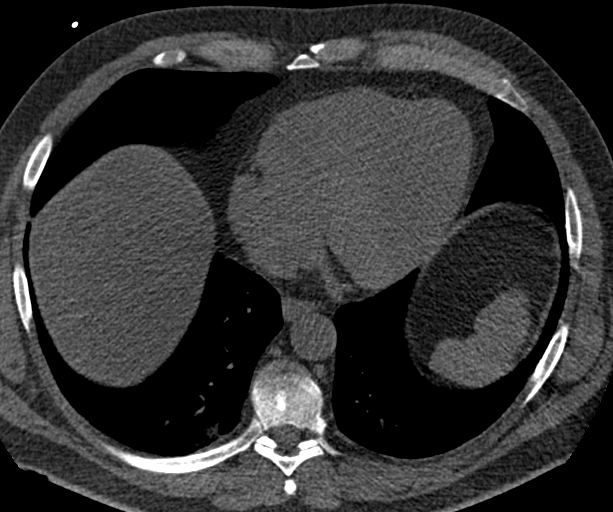
[im 30/60  vessel]
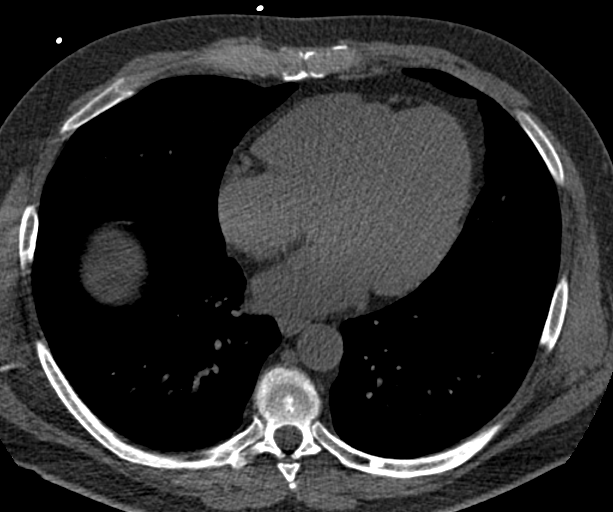
[im 40/60  vessel]
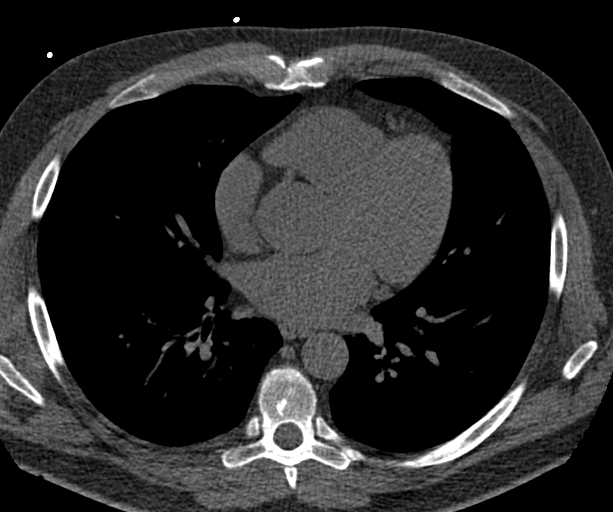
[im 50/60  vessel]
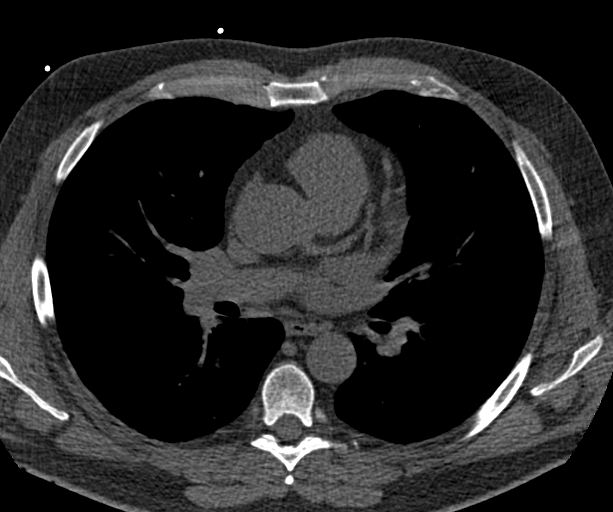
[im 50/60  lung]
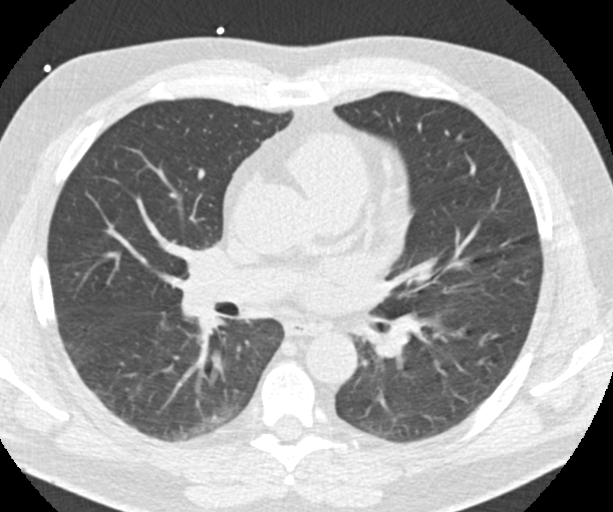

[Series 9: calcium scoring 2.00 br60 bestdiast 69% ax fov · axial · 0.56mm/px · z∈[+1449,+1529]mm · 5 of 60 slices shown]
[im 10/60  vessel]
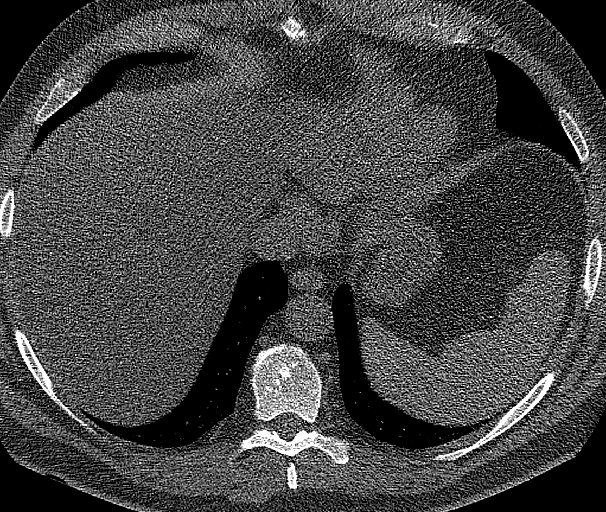
[im 20/60  vessel]
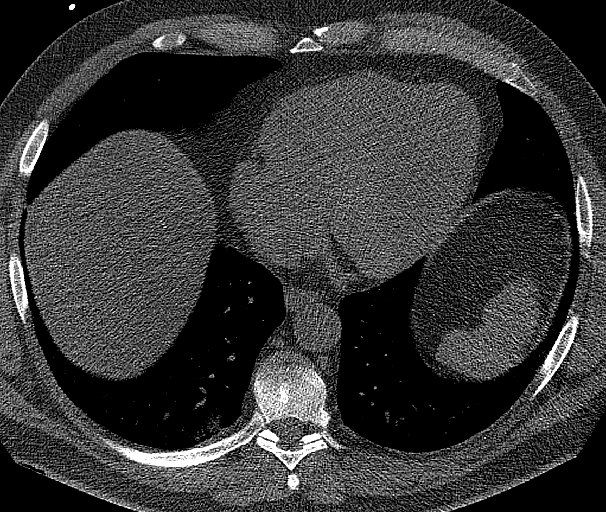
[im 30/60  vessel]
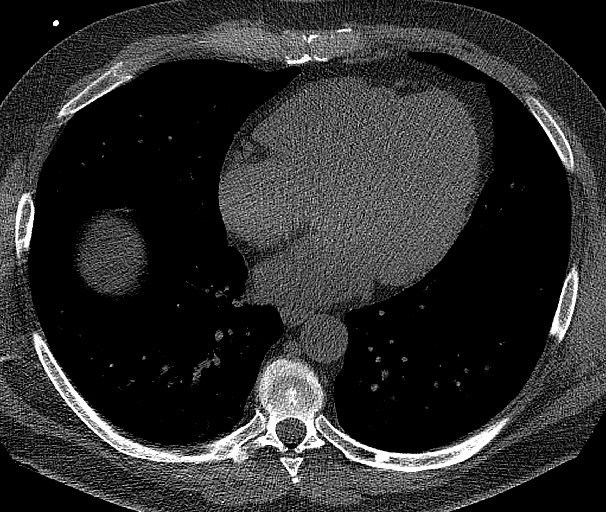
[im 40/60  vessel]
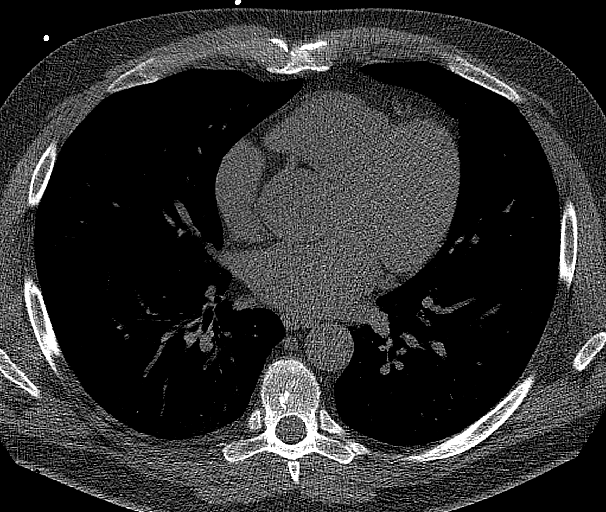
[im 50/60  vessel]
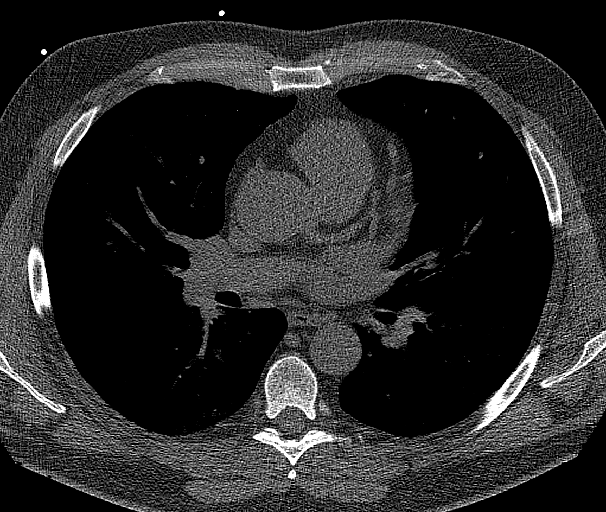

[14 of 20 positions shown; findings below may reference images not displayed]

FINDINGS: Technical quality: Good.

CORONARY CALCIUM

Total Agatston Score: 147

[HOSPITAL] percentile:  94th

OTHER FINDINGS:

Patchy areas of mild ground-glass attenuation are noted in the lower
lobes of the lungs bilaterally. Within the visualized portions of
the thorax there are no suspicious appearing pulmonary nodules or
masses, there is no acute consolidative airspace disease, no pleural
effusions, no pneumothorax and no lymphadenopathy. Visualized
portions of the upper abdomen demonstrates severe diffuse low
attenuation throughout the visualized hepatic parenchyma, indicative
of severe hepatic steatosis. There are no aggressive appearing lytic
or blastic lesions noted in the visualized portions of the skeleton.
IMPRESSION: 1. Patient's total coronary artery calcium score is 147 which is
94th percentile for patient's of matched age, gender and
race/ethnicity. Please note that although the presence of coronary
artery calcium documents the presence of coronary artery disease,
the severity of this disease and any potential stenosis cannot be
assessed on this non-contrast CT examination. Assessment for
potential risk factor modification, dietary therapy or pharmacologic
therapy may be warranted, if clinically indicated.
2. Patchy areas of ground-glass attenuation in the lung bases
bilaterally. This is nonspecific and some of this may be
attributable to atelectasis, however, the possibility of very early
interstitial lung disease should be considered. Repeat
high-resolution chest CT is suggested in 12 months to assess for
temporal changes in the appearance of the lung parenchyma.
Outpatient referral to Pulmonology should also be considered for
further evaluation if there is any clinical concern for interstitial
lung disease.
3. Severe hepatic steatosis.

## 2019-03-31 ENCOUNTER — Ambulatory Visit: Payer: No Typology Code available for payment source | Attending: Internal Medicine

## 2019-03-31 DIAGNOSIS — Z23 Encounter for immunization: Secondary | ICD-10-CM

## 2019-03-31 NOTE — Progress Notes (Signed)
   Covid-19 Vaccination Clinic  Name:  TIARNAN OSTERMEYER    MRN: [id number] DOB: 05-31-1970  03/31/2019  Mr. Laske was observed post Covid-19 immunization for 15 minutes without incident. He was provided with Vaccine Information Sheet and instruction to access the V-Safe system.   Mr. Acha was instructed to call 911 with any severe reactions post vaccine: Marland Kitchen Difficulty breathing  . Swelling of face and throat  . A fast heartbeat  . A bad rash all over body  . Dizziness and weakness   Immunizations Administered    Name Date Dose VIS Date Route   Moderna COVID-19 Vaccine 03/31/2019  9:07 AM 0.5 mL 12/06/2018 Intramuscular   Manufacturer: Moderna   Lot: [id number]   [id number]

## 2019-04-28 ENCOUNTER — Ambulatory Visit: Payer: No Typology Code available for payment source | Attending: Internal Medicine

## 2019-04-28 ENCOUNTER — Ambulatory Visit: Payer: No Typology Code available for payment source

## 2019-04-28 DIAGNOSIS — Z23 Encounter for immunization: Secondary | ICD-10-CM

## 2019-04-28 NOTE — Progress Notes (Signed)
   Covid-19 Vaccination Clinic  Name:  Drew Knight    MRN: [id number] DOB: 29-Oct-1970  04/28/2019  [name] was observed post Covid-19 immunization for 15 minutes without incident. He was provided with Vaccine Information Sheet and instruction to access the V-Safe system.   [name] was instructed to call 911 with any severe reactions post vaccine: Marland Kitchen Difficulty breathing  . Swelling of face and throat  . A fast heartbeat  . A bad rash all over body  . Dizziness and weakness   Immunizations Administered    Name Date Dose VIS Date Route   Moderna COVID-19 Vaccine 04/28/2019 10:39 AM 0.5 mL 12/2018 Intramuscular   Manufacturer: Moderna   Lot: [id number]   [id number]

## 2019-05-02 ENCOUNTER — Ambulatory Visit: Payer: No Typology Code available for payment source

## 2019-08-25 ENCOUNTER — Encounter: Payer: Self-pay | Admitting: Gastroenterology

## 2019-10-12 ENCOUNTER — Other Ambulatory Visit: Payer: Self-pay

## 2019-10-12 ENCOUNTER — Ambulatory Visit (AMBULATORY_SURGERY_CENTER): Payer: Self-pay | Admitting: *Deleted

## 2019-10-12 VITALS — Ht 73.0 in | Wt 230.0 lb

## 2019-10-12 DIAGNOSIS — Z1211 Encounter for screening for malignant neoplasm of colon: Secondary | ICD-10-CM

## 2019-10-12 MED ORDER — SUTAB 1479-225-188 MG PO TABS: 24.0000 | ORAL_TABLET | ORAL | 0 refills | Status: DC

## 2019-10-12 NOTE — Progress Notes (Signed)

## 2019-10-13 ENCOUNTER — Encounter: Payer: Self-pay | Admitting: Gastroenterology

## 2019-10-27 ENCOUNTER — Other Ambulatory Visit: Payer: Self-pay

## 2019-10-27 ENCOUNTER — Encounter: Payer: Self-pay | Admitting: Gastroenterology

## 2019-10-27 ENCOUNTER — Ambulatory Visit (AMBULATORY_SURGERY_CENTER): Payer: Managed Care, Other (non HMO) | Admitting: Gastroenterology

## 2019-10-27 VITALS — BP 137/80 | HR 65 | Temp 98.4°F | Resp 17 | Ht 73.0 in | Wt 230.0 lb

## 2019-10-27 DIAGNOSIS — D123 Benign neoplasm of transverse colon: Secondary | ICD-10-CM | POA: Diagnosis not present

## 2019-10-27 DIAGNOSIS — D126 Benign neoplasm of colon, unspecified: Secondary | ICD-10-CM | POA: Diagnosis not present

## 2019-10-27 DIAGNOSIS — D120 Benign neoplasm of cecum: Secondary | ICD-10-CM

## 2019-10-27 DIAGNOSIS — Z1211 Encounter for screening for malignant neoplasm of colon: Secondary | ICD-10-CM | POA: Diagnosis present

## 2019-10-27 DIAGNOSIS — D122 Benign neoplasm of ascending colon: Secondary | ICD-10-CM

## 2019-10-27 MED ORDER — SODIUM CHLORIDE 0.9 % IV SOLN: 500.0000 mL | Freq: Once | INTRAVENOUS | Status: DC

## 2019-10-27 NOTE — Patient Instructions (Signed)
Findings: Hemorrhoids, Polyps (Handouts given)  Repeat Colonoscopy in 3-5 years  YOU HAD AN ENDOSCOPIC PROCEDURE TODAY AT [facility]:   Refer to the procedure report that was given to you for any specific questions about what was found during the examination.  If the procedure report does not answer your questions, please call your gastroenterologist to clarify.  If you requested that your care partner not be given the details of your procedure findings, then the procedure report has been included in a sealed envelope for you to review at your convenience later.  YOU SHOULD EXPECT: Some feelings of bloating in the abdomen. Passage of more gas than usual.  Walking can help get rid of the air that was put into your GI tract during the procedure and reduce the bloating. If you had a lower endoscopy (such as a colonoscopy or flexible sigmoidoscopy) you may notice spotting of blood in your stool or on the toilet paper. If you underwent a bowel prep for your procedure, you may not have a normal bowel movement for a few days.  Please Note:  You might notice some irritation and congestion in your nose or some drainage.  This is from the oxygen used during your procedure.  There is no need for concern and it should clear up in a day or so.  SYMPTOMS TO REPORT IMMEDIATELY:   Following lower endoscopy (colonoscopy or flexible sigmoidoscopy):  Excessive amounts of blood in the stool  Significant tenderness or worsening of abdominal pains  Swelling of the abdomen that is new, acute  Fever of 100F or higher    For urgent or emergent issues, a gastroenterologist can be reached at any hour by calling [phone]. Do not use MyChart messaging for urgent concerns.    DIET:  We do recommend a small meal at first, but then you may proceed to your regular diet.  Drink plenty of fluids but you should avoid alcoholic beverages for 24 hours.  ACTIVITY:  You should plan to take it easy for  the rest of today and you should NOT DRIVE or use heavy machinery until tomorrow (because of the sedation medicines used during the test).    FOLLOW UP: Our staff will call the number listed on your records 48-72 hours following your procedure to check on you and address any questions or concerns that you may have regarding the information given to you following your procedure. If we do not reach you, we will leave a message.  We will attempt to reach you two times.  During this call, we will ask if you have developed any symptoms of COVID 19. If you develop any symptoms (ie: fever, flu-like symptoms, shortness of breath, cough etc.) before then, please call [phone].  If you test positive for Covid 19 in the 2 weeks post procedure, please call and report this information to [place].    If any biopsies were taken you will be contacted by phone or by letter within the next 1-3 weeks.  Please call us at [phone] if you have not heard about the biopsies in 3 weeks.    SIGNATURES/CONFIDENTIALITY: You and/or your care partner have signed paperwork which will be entered into your electronic medical record.  These signatures attest to the fact that that the information above on your After Visit Summary has been reviewed and is understood.  Full responsibility of the confidentiality of this discharge information lies with you and/or your care-partner.

## 2019-10-27 NOTE — Op Note (Signed)
[facility] Patient Name: Drew Knight Procedure Date: 10/27/2019 1:19 PM MRN: [id number] Endoscopist: Mauri Pole , MD Age: 49 Referring MD:  Date of Birth: 14-Aug-1970 Gender: Male Account #: [id number] Procedure:                Colonoscopy Indications:              Screening for colorectal malignant neoplasm Medicines:                Monitored Anesthesia Care Procedure:                Pre-Anesthesia Assessment:                           - Prior to the procedure, a History and Physical                            was performed, and patient medications and                            allergies were reviewed. The patient's tolerance of                            previous anesthesia was also reviewed. The risks                            and benefits of the procedure and the sedation                            options and risks were discussed with the patient.                            All questions were answered, and informed consent                            was obtained. Prior Anticoagulants: The patient has                            taken no previous anticoagulant or antiplatelet                            agents. ASA Grade Assessment: II - A patient with                            mild systemic disease. After reviewing the risks                            and benefits, the patient was deemed in                            satisfactory condition to undergo the procedure.                           After obtaining informed consent, the colonoscope  was passed under direct vision. Throughout the                            procedure, the patient's blood pressure, pulse, and                            oxygen saturations were monitored continuously. The                            Colonoscope was introduced through the anus and                            advanced to the the cecum, identified by                            appendiceal orifice and  ileocecal valve. The                            colonoscopy was performed without difficulty. The                            patient tolerated the procedure well. The quality                            of the bowel preparation was adequate. The                            ileocecal valve, appendiceal orifice, and rectum                            were photographed. Scope In: 1:48:17 PM Scope Out: 2:15:28 PM Scope Withdrawal Time: 0 hours 23 minutes 12 seconds  Total Procedure Duration: 0 hours 27 minutes 11 seconds  Findings:                 The perianal and digital rectal examinations were                            normal.                           A 12 mm polyp was found in the cecum. The polyp was                            sessile. The polyp was removed with a hot snare.                            Resection and retrieval were complete.                           Two sessile polyps were found in the hepatic                            flexure and ascending colon. The polyps were 3 to 5  mm in size. These polyps were removed with a cold                            snare. Resection and retrieval were complete.                           Two sessile polyps were found in the transverse                            colon and cecum. The polyps were 1 to 2 mm in size.                            These polyps were removed with a cold biopsy                            forceps. Resection and retrieval were complete.                           Non-bleeding internal hemorrhoids were found during                            retroflexion. The hemorrhoids were medium-sized. Complications:            No immediate complications. Estimated Blood Loss:     Estimated blood loss was minimal. Impression:               - One 12 mm polyp in the cecum, removed with a hot                            snare. Resected and retrieved.                           - Two 3 to 5 mm polyps at the hepatic  flexure and                            in the ascending colon, removed with a cold snare.                            Resected and retrieved.                           - Two 1 to 2 mm polyps in the transverse colon and                            in the cecum, removed with a cold biopsy forceps.                            Resected and retrieved.                           - Non-bleeding internal hemorrhoids. Recommendation:           - Patient has a contact number available for  emergencies. The signs and symptoms of potential                            delayed complications were discussed with the                            patient. Return to normal activities tomorrow.                            Written discharge instructions were provided to the                            patient.                           - Resume previous diet.                           - Continue present medications.                           - Await pathology results.                           - Repeat colonoscopy in 3 - 5 years for                            surveillance based on pathology results. Mauri Pole, MD 10/27/2019 2:21:23 PM This report has been signed electronically.

## 2019-10-27 NOTE — Progress Notes (Signed)
To pacu, vss. Report to [email or web address] 

## 2019-10-27 NOTE — Progress Notes (Signed)
Pt. Reports no change in medical or surgical history since his pre-visit 10/12/2019.

## 2019-10-27 NOTE — Progress Notes (Signed)
Called to room to assist during endoscopic procedure.  Patient ID and intended procedure confirmed with present staff. Received instructions for my participation in the procedure from the performing physician.  

## 2019-10-31 ENCOUNTER — Telehealth: Payer: Self-pay | Admitting: *Deleted

## 2019-10-31 NOTE — Telephone Encounter (Signed)
  Follow up Call-  Call back number 10/27/2019  Post procedure Call Back phone  # [phone]  Permission to leave phone message Yes  Some recent data might be hidden     Patient questions:  Do you have a fever, pain , or abdominal swelling? No. Pain Score  0 *  Have you tolerated food without any problems? Yes.    Have you been able to return to your normal activities? Yes.    Do you have any questions about your discharge instructions: Diet   No. Medications  No. Follow up visit  No.  Do you have questions or concerns about your Care? No.  Actions: * If pain score is 4 or above: No action needed, pain <4.  1. Have you developed a fever since your procedure? no  2.   Have you had an respiratory symptoms (SOB or cough) since your procedure? no  3.   Have you tested positive for COVID 19 since your procedure no  4.   Have you had any family members/close contacts diagnosed with the COVID 19 since your procedure?  no   If yes to any of these questions please route to Joylene John, RN and Joella Prince, RN

## 2019-11-14 ENCOUNTER — Encounter: Payer: Self-pay | Admitting: Gastroenterology

## 2020-02-16 ENCOUNTER — Emergency Department (HOSPITAL_BASED_OUTPATIENT_CLINIC_OR_DEPARTMENT_OTHER)
Admission: EM | Admit: 2020-02-16 | Discharge: 2020-02-16 | Disposition: A | Discharge status: Home / Self Care | Payer: Managed Care, Other (non HMO) | Attending: Emergency Medicine | Admitting: Emergency Medicine

## 2020-02-16 ENCOUNTER — Other Ambulatory Visit: Payer: Self-pay

## 2020-02-16 ENCOUNTER — Encounter (HOSPITAL_BASED_OUTPATIENT_CLINIC_OR_DEPARTMENT_OTHER): Payer: Self-pay

## 2020-02-16 DIAGNOSIS — Z96653 Presence of artificial knee joint, bilateral: Secondary | ICD-10-CM | POA: Insufficient documentation

## 2020-02-16 DIAGNOSIS — W000XXA Fall on same level due to ice and snow, initial encounter: Secondary | ICD-10-CM | POA: Diagnosis not present

## 2020-02-16 DIAGNOSIS — S060X0A Concussion without loss of consciousness, initial encounter: Secondary | ICD-10-CM | POA: Diagnosis not present

## 2020-02-16 DIAGNOSIS — S0990XA Unspecified injury of head, initial encounter: Secondary | ICD-10-CM | POA: Diagnosis present

## 2020-02-16 DIAGNOSIS — I10 Essential (primary) hypertension: Secondary | ICD-10-CM | POA: Insufficient documentation

## 2020-02-16 DIAGNOSIS — Z87891 Personal history of nicotine dependence: Secondary | ICD-10-CM | POA: Insufficient documentation

## 2020-02-16 DIAGNOSIS — Z79899 Other long term (current) drug therapy: Secondary | ICD-10-CM | POA: Insufficient documentation

## 2020-02-16 NOTE — ED Triage Notes (Signed)
Pt arrives with c/o continued headaches since fall on ice a few weeks ago denies LOC, no blood thinners, pt has video on his phone showing him slipping and falling straight back hitting his head. C/O afternoon headaches and some brain fog since.

## 2020-02-16 NOTE — Discharge Instructions (Signed)
It appears you suffered a concussion during your fall.  It is normal after concussion to have headache lingering for several weeks.  Continue to take Advil and Tylenol as needed.  If you develop any new symptoms or worsening headache, please seek a medical professional for evaluation.  At this point it is unlikely you have any underlying swelling or bleeding in your head.

## 2020-02-16 NOTE — ED Provider Notes (Signed)
[facility] Provider Note   CSN: [id number] Arrival date & time: 02/16/20  0932     History Chief Complaint  Patient presents with  . Fall    Drew Knight is a 50 y.o. male.   Patient fell in his driveway on some ice 3 weeks ago and hit the back of his head.  He has video of this and showed it to me.  Did not lose consciousness.  Had some "stars" in his vision for 5 minutes, but other than that has only had headache.  Since that time he has had daily headaches that get worse around 2 PM.  He takes 3 Advil liquid gels and this improves the headache for the rest of the day.  Has not had any other loss of consciousness or vision changes since the accident.  States he does not want to end up "like [employer]" which is partly why he is here.        Past Medical History:  Diagnosis Date  . Allergy    last 2 yrs   . Hyperlipidemia   . Hypertension   . Interstitial lung disease (HCC)    2 spots - 1 each lung - following     Patient Active Problem List   Diagnosis Date Noted  . HYPERLIPIDEMIA NEC/NOS 11/04/2005  . ABUSE, ALCOHOL, UNSPECIFIED 11/04/2005  . HYPERTENSION 11/04/2005  . HX, PERSONAL, PAST NONCOMPLIANCE 11/04/2005    Past Surgical History:  Procedure Laterality Date  . COLONOSCOPY  1996   due to hemorrhoids   . HAND DEBRIDEMENT    . JOINT REPLACEMENT    . KNEE ARTHROSCOPY W/ ACL RECONSTRUCTION    . KNEE SURGERY     x 9 total   . TOTAL KNEE ARTHROPLASTY Bilateral        Family History  Problem Relation Age of Onset  . Hypertension Mother   . Heart disease Father   . Hyperlipidemia Father   . Hypertension Father   . Hyperlipidemia Brother   . Rectal cancer Maternal Grandfather   . Colon cancer Neg Hx   . Colon polyps Neg Hx   . Esophageal cancer Neg Hx   . Stomach cancer Neg Hx     Social History   Tobacco Use  . Smoking status: Former Smoker    Quit date: 06/05/2004    Years since quitting: 15.7  . Smokeless  tobacco: Never Used  Substance Use Topics  . Alcohol use: Yes    Comment: social  5 days a week   . Drug use: No    Home Medications Prior to Admission medications   Medication Sig Start Date End Date Taking? Authorizing Provider  ezetimibe (ZETIA) 10 MG tablet Take 10 mg by mouth daily. 07/30/19  Yes [provider]  fenofibrate 160 MG tablet  06/06/18  Yes [provider]  losartan (COZAAR) 100 MG tablet Take 100 mg by mouth daily. 04/28/18  Yes [provider]    Allergies    Tomato  Review of Systems   Review of Systems  All other systems reviewed and are negative.   Physical Exam Updated Vital Signs BP (!) 147/85 (BP Location: Right Arm)   Pulse 65   Temp 98 F (36.7 C) (Oral)   Resp 18   Ht 6\' 1"  (1.854 m)   Wt 104 kg   SpO2 98%   BMI 30.24 kg/m   Physical Exam Constitutional:      General: He is  not in acute distress.    Appearance: He is normal weight.  HENT:     Head: Normocephalic and atraumatic.     Nose: Nose normal. No congestion.     Mouth/Throat:     Mouth: Mucous membranes are moist.     Pharynx: Oropharynx is clear.  Eyes:     Extraocular Movements: Extraocular movements intact.     Conjunctiva/sclera: Conjunctivae normal.     Pupils: Pupils are equal, round, and reactive to light.  Cardiovascular:     Rate and Rhythm: Normal rate and regular rhythm.     Pulses: Normal pulses.     Heart sounds: No murmur heard.   Pulmonary:     Effort: Pulmonary effort is normal. No respiratory distress.     Breath sounds: No wheezing.  Abdominal:     General: Abdomen is flat.     Palpations: Abdomen is soft.  Musculoskeletal:        General: Normal range of motion.     Cervical back: Normal range of motion. No rigidity or tenderness.  Skin:    General: Skin is warm and dry.  Neurological:     General: No focal deficit present.     Mental Status: He is alert and oriented to person, place, and time.     Cranial Nerves: No  cranial nerve deficit.     Sensory: No sensory deficit.     Motor: No weakness.     Coordination: Coordination normal.  Psychiatric:        Mood and Affect: Mood normal.        Behavior: Behavior normal.        Thought Content: Thought content normal.     ED Results / Procedures / Treatments   Labs (all labs ordered are listed, but only abnormal results are displayed) Labs Reviewed - No data to display  EKG None  Radiology No results found.  Procedures Procedures   Medications Ordered in ED Medications - No data to display  ED Course  I have reviewed the triage vital signs and the nursing notes.  Pertinent labs & imaging results that were available during my care of the patient were reviewed by me and considered in my medical decision making (see chart for details).    MDM Rules/Calculators/A&P                          Patient presents with continued headache 3 weeks after a fall in which he hit the back of his head on ice. He denies other symptoms.  Headache is not intractable; usually starts in the afternoon.  Discussed with patient that it is unlikely he has intracranial bleeding or swelling at this point and that his headaches are likely secondary to his concussion and these can occur for several weeks after the initial concussion.  Discussed red flags and reasons to return to the ed.  Advised pt he can take tylenol and advil as needed for headaches.   Final Clinical Impression(s) / ED Diagnoses Final diagnoses:  Concussion without loss of consciousness, initial encounter    Rx / DC Orders ED Discharge Orders    None       Benay Pike, MD 02/16/20 1050    Lucrezia Starch, MD 02/17/20 [phone]

## 2020-07-22 ENCOUNTER — Other Ambulatory Visit (HOSPITAL_COMMUNITY): Payer: Self-pay | Admitting: *Deleted

## 2020-07-23 ENCOUNTER — Ambulatory Visit (HOSPITAL_COMMUNITY)
Admission: RE | Admit: 2020-07-23 | Discharge: 2020-07-23 | Disposition: A | Discharge status: Home / Self Care | Payer: Managed Care, Other (non HMO) | Source: Ambulatory Visit | Attending: Internal Medicine | Admitting: Internal Medicine

## 2020-07-23 ENCOUNTER — Other Ambulatory Visit: Payer: Self-pay

## 2020-07-23 DIAGNOSIS — I2510 Atherosclerotic heart disease of native coronary artery without angina pectoris: Secondary | ICD-10-CM | POA: Diagnosis not present

## 2020-07-23 DIAGNOSIS — E785 Hyperlipidemia, unspecified: Secondary | ICD-10-CM | POA: Diagnosis present

## 2020-07-23 MED ORDER — INCLISIRAN SODIUM 284 MG/1.5ML ~~LOC~~ SOSY
284.0000 mg | PREFILLED_SYRINGE | Freq: Once | SUBCUTANEOUS | Status: AC
  Administered 2020-07-23: 284 mg via SUBCUTANEOUS
  Filled 2020-07-23: qty 1.5

## 2020-10-28 ENCOUNTER — Other Ambulatory Visit (HOSPITAL_COMMUNITY): Payer: Self-pay | Admitting: *Deleted

## 2020-10-29 ENCOUNTER — Encounter (HOSPITAL_COMMUNITY)
Admission: RE | Admit: 2020-10-29 | Discharge: 2020-10-29 | Disposition: A | Discharge status: Home / Self Care | Payer: Managed Care, Other (non HMO) | Source: Ambulatory Visit | Attending: Internal Medicine | Admitting: Internal Medicine

## 2020-10-29 ENCOUNTER — Other Ambulatory Visit: Payer: Self-pay

## 2020-10-29 DIAGNOSIS — I2510 Atherosclerotic heart disease of native coronary artery without angina pectoris: Secondary | ICD-10-CM | POA: Insufficient documentation

## 2020-10-29 DIAGNOSIS — E785 Hyperlipidemia, unspecified: Secondary | ICD-10-CM | POA: Diagnosis not present

## 2020-10-29 MED ORDER — INCLISIRAN SODIUM 284 MG/1.5ML ~~LOC~~ SOSY
284.0000 mg | PREFILLED_SYRINGE | Freq: Once | SUBCUTANEOUS | Status: AC
  Administered 2020-10-29: 284 mg via SUBCUTANEOUS

## 2020-10-29 MED ORDER — INCLISIRAN SODIUM 284 MG/1.5ML ~~LOC~~ SOSY
PREFILLED_SYRINGE | SUBCUTANEOUS | Status: AC
  Filled 2020-10-29: qty 1.5

## 2021-04-14 ENCOUNTER — Other Ambulatory Visit (HOSPITAL_COMMUNITY): Payer: Self-pay | Admitting: *Deleted

## 2021-04-15 ENCOUNTER — Ambulatory Visit (HOSPITAL_COMMUNITY)
Admission: RE | Admit: 2021-04-15 | Discharge: 2021-04-15 | Disposition: A | Discharge status: Home / Self Care | Payer: Managed Care, Other (non HMO) | Source: Ambulatory Visit | Attending: Internal Medicine | Admitting: Internal Medicine

## 2021-04-15 DIAGNOSIS — I2510 Atherosclerotic heart disease of native coronary artery without angina pectoris: Secondary | ICD-10-CM | POA: Diagnosis not present

## 2021-04-15 DIAGNOSIS — E785 Hyperlipidemia, unspecified: Secondary | ICD-10-CM | POA: Insufficient documentation

## 2021-04-15 MED ORDER — INCLISIRAN SODIUM 284 MG/1.5ML ~~LOC~~ SOSY
284.0000 mg | PREFILLED_SYRINGE | Freq: Once | SUBCUTANEOUS | Status: AC
  Administered 2021-04-15: 284 mg via SUBCUTANEOUS

## 2021-04-15 MED ORDER — INCLISIRAN SODIUM 284 MG/1.5ML ~~LOC~~ SOSY
PREFILLED_SYRINGE | SUBCUTANEOUS | Status: AC
  Filled 2021-04-15: qty 1.5

## 2021-09-29 ENCOUNTER — Other Ambulatory Visit (HOSPITAL_COMMUNITY): Payer: Self-pay | Admitting: *Deleted

## 2021-09-30 ENCOUNTER — Encounter (HOSPITAL_COMMUNITY)
Admission: RE | Admit: 2021-09-30 | Discharge: 2021-09-30 | Disposition: A | Discharge status: Home / Self Care | Payer: Managed Care, Other (non HMO) | Source: Ambulatory Visit | Attending: Internal Medicine | Admitting: Internal Medicine

## 2021-09-30 DIAGNOSIS — E785 Hyperlipidemia, unspecified: Secondary | ICD-10-CM | POA: Insufficient documentation

## 2021-09-30 DIAGNOSIS — I2510 Atherosclerotic heart disease of native coronary artery without angina pectoris: Secondary | ICD-10-CM | POA: Diagnosis not present

## 2021-09-30 MED ORDER — INCLISIRAN SODIUM 284 MG/1.5ML ~~LOC~~ SOSY
284.0000 mg | PREFILLED_SYRINGE | Freq: Once | SUBCUTANEOUS | Status: AC
  Administered 2021-09-30: 284 mg via SUBCUTANEOUS

## 2021-09-30 MED ORDER — INCLISIRAN SODIUM 284 MG/1.5ML ~~LOC~~ SOSY
PREFILLED_SYRINGE | SUBCUTANEOUS | Status: AC
  Filled 2021-09-30: qty 1.5

## 2022-03-31 ENCOUNTER — Inpatient Hospital Stay (HOSPITAL_COMMUNITY): Admission: RE | Admit: 2022-03-31 | Payer: Managed Care, Other (non HMO) | Source: Ambulatory Visit

## 2022-07-27 ENCOUNTER — Other Ambulatory Visit: Payer: Self-pay | Admitting: Internal Medicine

## 2022-07-27 DIAGNOSIS — J849 Interstitial pulmonary disease, unspecified: Secondary | ICD-10-CM

## 2022-08-11 ENCOUNTER — Ambulatory Visit
Admission: RE | Admit: 2022-08-11 | Discharge: 2022-08-11 | Disposition: A | Discharge status: Home / Self Care | Payer: No Typology Code available for payment source | Source: Ambulatory Visit | Attending: Internal Medicine | Admitting: Internal Medicine

## 2022-08-11 DIAGNOSIS — J849 Interstitial pulmonary disease, unspecified: Secondary | ICD-10-CM
